# Patient Record
Sex: Female | Born: 1945
Health system: Southern US, Community
[De-identification: ages and names within clinical notes are randomized; demographics above are authoritative.]

## PROBLEM LIST (undated history)

## (undated) DIAGNOSIS — E663 Overweight: Secondary | ICD-10-CM

## (undated) DIAGNOSIS — K5730 Diverticulosis of large intestine without perforation or abscess without bleeding: Secondary | ICD-10-CM

## (undated) DIAGNOSIS — E7800 Pure hypercholesterolemia, unspecified: Secondary | ICD-10-CM

## (undated) DIAGNOSIS — I519 Heart disease, unspecified: Secondary | ICD-10-CM

## (undated) DIAGNOSIS — K219 Gastro-esophageal reflux disease without esophagitis: Secondary | ICD-10-CM

## (undated) DIAGNOSIS — G5602 Carpal tunnel syndrome, left upper limb: Secondary | ICD-10-CM

## (undated) DIAGNOSIS — I10 Essential (primary) hypertension: Secondary | ICD-10-CM

## (undated) DIAGNOSIS — J302 Other seasonal allergic rhinitis: Secondary | ICD-10-CM

## (undated) DIAGNOSIS — K642 Third degree hemorrhoids: Secondary | ICD-10-CM

## (undated) DIAGNOSIS — H409 Unspecified glaucoma: Secondary | ICD-10-CM

## (undated) DIAGNOSIS — J3089 Other allergic rhinitis: Secondary | ICD-10-CM

## (undated) DIAGNOSIS — Z8673 Personal history of transient ischemic attack (TIA), and cerebral infarction without residual deficits: Secondary | ICD-10-CM

## (undated) HISTORY — DX: Heart disease, unspecified: I51.9

## (undated) HISTORY — DX: Personal history of transient ischemic attack (TIA), and cerebral infarction without residual deficits: Z86.73

## (undated) HISTORY — DX: Other seasonal allergic rhinitis: J30.2

## (undated) HISTORY — DX: Pure hypercholesterolemia, unspecified: E78.00

## (undated) HISTORY — DX: Third degree hemorrhoids: K64.2

## (undated) HISTORY — DX: Gastro-esophageal reflux disease without esophagitis: K21.9

## (undated) HISTORY — DX: Overweight: E66.3

## (undated) HISTORY — DX: Essential (primary) hypertension: I10

## (undated) HISTORY — DX: Unspecified glaucoma: H40.9

## (undated) HISTORY — DX: Diverticulosis of large intestine without perforation or abscess without bleeding: K57.30

## (undated) HISTORY — DX: Other allergic rhinitis: J30.89

## (undated) HISTORY — DX: Carpal tunnel syndrome, left upper limb: G56.02

---

## 1978-03-30 HISTORY — PX: TUBAL LIGATION: SHX77

## 2002-07-12 ENCOUNTER — Ambulatory Visit (HOSPITAL_COMMUNITY): Admission: RE | Admit: 2002-07-12 | Discharge: 2002-07-12 | Payer: Self-pay | Admitting: *Deleted

## 2016-04-30 DIAGNOSIS — I10 Essential (primary) hypertension: Secondary | ICD-10-CM | POA: Diagnosis not present

## 2016-04-30 DIAGNOSIS — Z1231 Encounter for screening mammogram for malignant neoplasm of breast: Secondary | ICD-10-CM | POA: Diagnosis not present

## 2016-04-30 DIAGNOSIS — Z2821 Immunization not carried out because of patient refusal: Secondary | ICD-10-CM | POA: Diagnosis not present

## 2016-04-30 DIAGNOSIS — E785 Hyperlipidemia, unspecified: Secondary | ICD-10-CM | POA: Diagnosis not present

## 2016-05-25 DIAGNOSIS — Z1231 Encounter for screening mammogram for malignant neoplasm of breast: Secondary | ICD-10-CM | POA: Diagnosis not present

## 2016-06-26 DIAGNOSIS — H2513 Age-related nuclear cataract, bilateral: Secondary | ICD-10-CM | POA: Diagnosis not present

## 2016-06-26 DIAGNOSIS — H401131 Primary open-angle glaucoma, bilateral, mild stage: Secondary | ICD-10-CM | POA: Diagnosis not present

## 2016-08-11 DIAGNOSIS — D649 Anemia, unspecified: Secondary | ICD-10-CM | POA: Diagnosis not present

## 2016-08-11 DIAGNOSIS — L608 Other nail disorders: Secondary | ICD-10-CM | POA: Diagnosis not present

## 2016-09-24 DIAGNOSIS — H04123 Dry eye syndrome of bilateral lacrimal glands: Secondary | ICD-10-CM | POA: Diagnosis not present

## 2016-09-24 DIAGNOSIS — H1045 Other chronic allergic conjunctivitis: Secondary | ICD-10-CM | POA: Diagnosis not present

## 2016-10-07 DIAGNOSIS — H04123 Dry eye syndrome of bilateral lacrimal glands: Secondary | ICD-10-CM | POA: Diagnosis not present

## 2016-10-07 DIAGNOSIS — H401131 Primary open-angle glaucoma, bilateral, mild stage: Secondary | ICD-10-CM | POA: Diagnosis not present

## 2016-11-12 DIAGNOSIS — Z2821 Immunization not carried out because of patient refusal: Secondary | ICD-10-CM | POA: Diagnosis not present

## 2016-11-12 DIAGNOSIS — Z87891 Personal history of nicotine dependence: Secondary | ICD-10-CM | POA: Diagnosis not present

## 2016-11-12 DIAGNOSIS — I10 Essential (primary) hypertension: Secondary | ICD-10-CM | POA: Diagnosis not present

## 2016-11-12 DIAGNOSIS — Z1159 Encounter for screening for other viral diseases: Secondary | ICD-10-CM | POA: Diagnosis not present

## 2016-11-12 DIAGNOSIS — E785 Hyperlipidemia, unspecified: Secondary | ICD-10-CM | POA: Diagnosis not present

## 2016-12-11 ENCOUNTER — Emergency Department (HOSPITAL_COMMUNITY)
Admission: EM | Admit: 2016-12-11 | Discharge: 2016-12-11 | Disposition: A | Discharge status: Home / Self Care | Payer: Medicare Other | Attending: Emergency Medicine | Admitting: Emergency Medicine

## 2016-12-11 ENCOUNTER — Emergency Department (HOSPITAL_COMMUNITY): Payer: Medicare Other

## 2016-12-11 ENCOUNTER — Encounter (HOSPITAL_COMMUNITY): Payer: Self-pay | Admitting: Emergency Medicine

## 2016-12-11 DIAGNOSIS — R55 Syncope and collapse: Secondary | ICD-10-CM | POA: Diagnosis not present

## 2016-12-11 DIAGNOSIS — R531 Weakness: Secondary | ICD-10-CM | POA: Diagnosis not present

## 2016-12-11 DIAGNOSIS — I10 Essential (primary) hypertension: Secondary | ICD-10-CM | POA: Diagnosis not present

## 2016-12-11 DIAGNOSIS — R42 Dizziness and giddiness: Secondary | ICD-10-CM | POA: Insufficient documentation

## 2016-12-11 DIAGNOSIS — Z79899 Other long term (current) drug therapy: Secondary | ICD-10-CM | POA: Diagnosis not present

## 2016-12-11 DIAGNOSIS — R404 Transient alteration of awareness: Secondary | ICD-10-CM | POA: Diagnosis not present

## 2016-12-11 LAB — TROPONIN I

## 2016-12-11 LAB — BASIC METABOLIC PANEL
Anion gap: 11 (ref 5–15)
BUN: 8 mg/dL (ref 6–20)
CALCIUM: 10.2 mg/dL (ref 8.9–10.3)
CO2: 26 mmol/L (ref 22–32)
CREATININE: 0.8 mg/dL (ref 0.44–1.00)
Chloride: 97 mmol/L — ABNORMAL LOW (ref 101–111)
GFR calc Af Amer: >60 mL/min (ref >60)
Glucose, Bld: 95 mg/dL (ref 65–99)
Potassium: 3.6 mmol/L (ref 3.5–5.1)
SODIUM: 134 mmol/L — AB (ref 135–145)

## 2016-12-11 LAB — CBC WITH DIFFERENTIAL/PLATELET
BASOS ABS: 0 10*3/uL (ref 0.0–0.1)
Basophils Relative: 0 %
EOS PCT: 1 %
Eosinophils Absolute: 0.1 10*3/uL (ref 0.0–0.7)
HCT: 41.1 % (ref 36.0–46.0)
Hemoglobin: 13.9 g/dL (ref 12.0–15.0)
Lymphocytes Relative: 13 %
Lymphs Abs: 1.1 10*3/uL (ref 0.7–4.0)
MCH: 26.2 pg (ref 26.0–34.0)
MCHC: 33.8 g/dL (ref 30.0–36.0)
MCV: 77.5 fL — ABNORMAL LOW (ref 78.0–100.0)
Monocytes Absolute: 0.5 10*3/uL (ref 0.1–1.0)
Monocytes Relative: 5 %
Neutro Abs: 6.8 10*3/uL (ref 1.7–7.7)
Neutrophils Relative %: 81 %
PLATELETS: 246 10*3/uL (ref 150–400)
RBC: 5.3 MIL/uL — AB (ref 3.87–5.11)
RDW: 13.1 % (ref 11.5–15.5)
WBC: 8.5 10*3/uL (ref 4.0–10.5)

## 2016-12-11 LAB — CBG MONITORING, ED: Glucose-Capillary: 103 mg/dL — ABNORMAL HIGH (ref 65–99)

## 2016-12-11 LAB — URINALYSIS, ROUTINE W REFLEX MICROSCOPIC
BACTERIA UA: NONE SEEN
BILIRUBIN URINE: NEGATIVE
Glucose, UA: NEGATIVE mg/dL
HGB URINE DIPSTICK: NEGATIVE
Ketones, ur: NEGATIVE mg/dL
NITRITE: NEGATIVE
PROTEIN: NEGATIVE mg/dL
Specific Gravity, Urine: 1.011 (ref 1.005–1.030)
pH: 7 (ref 5.0–8.0)

## 2016-12-11 NOTE — ED Notes (Signed)
Patient denies having anything to eat today. Patient has drank prune juice, tea, coffee and on 2nd bottle of water when felt like going to pass out.

## 2016-12-11 NOTE — Discharge Instructions (Signed)
Get plenty of rest, and drink a lot of fluids.  See your doctor for a check up in 1-2 weeks. Return here if needed.

## 2016-12-11 NOTE — Care Management Note (Signed)
Case Management Note  CM consulted for no pcp.  Spoke with pt who states she has Medicare and Medicaid, recently moving here from the [place] part of the state.  She states there are so many doctors she doesn't know how to choose one.   Advised pt that at time of D/C there would be a thick black band printed a few pages back on her D/C paperwork for assistance with finding a provider.  She stated she will call them for help.  No further CM needs noted at this time.

## 2016-12-11 NOTE — ED Provider Notes (Signed)
[place] DEPT Provider Note   CSN: [id number] Arrival date & time: 12/11/16  1507     History   Chief Complaint Chief Complaint  Patient presents with  . Near Syncope    HPI Barbara Holt is a 71 y.o. female.  She presents for evaluation of near syncope, which occurred suddenly while she was sitting and writing.  She felt her head jerked forward, then felt woozy for about 15 minutes.  She presents by EMS and was noted to be unsteady with walking.  She denies recent fever, chills, nausea, vomiting, dysuria, constipation, diarrhea, back pain or abdominal pain.  No prior similar problems.  There are no other known modifying factors.  HPI  Past Medical History:  Diagnosis Date  . High cholesterol   . Hypertension     There are no active problems to display for this patient.   History reviewed. No pertinent surgical history.  OB History    No data available       Home Medications    Prior to Admission medications   Medication Sig Start Date End Date Taking? Authorizing Provider  potassium chloride SA (K-DUR,KLOR-CON) 20 MEQ tablet Take 1 tablet by mouth daily. 10/29/16 10/29/17 Yes [provider]  pravastatin (PRAVACHOL) 20 MG tablet Take 20 mg by mouth daily. 10/29/16 10/29/17 Yes [provider]  atenolol-chlorthalidone (TENORETIC) 100-25 MG tablet Take 1 tablet by mouth every morning. 10/30/16   [provider]  latanoprost (XALATAN) 0.005 % ophthalmic solution Place 1 drop into both eyes daily.    [provider]  timolol (BETIMOL) 0.5 % ophthalmic solution Place 1 drop into both eyes daily.    [provider]    Family History No family history on file.  Social History Social History  Substance Use Topics  . Smoking status: Never Smoker  . Smokeless tobacco: Never Used  . Alcohol use No     Allergies   Patient has no known allergies.   Review of Systems Review of Systems  All other systems reviewed and are  negative.    Physical Exam Updated Vital Signs BP 137/86   Pulse 69   Temp 97.6 F (36.4 C) (Oral)   Resp 15   SpO2 100%   Physical Exam  Constitutional: She is oriented to person, place, and time. She appears well-developed and well-nourished. No distress.  HENT:  Head: Normocephalic and atraumatic.  Eyes: Pupils are equal, round, and reactive to light. Conjunctivae and EOM are normal.  Neck: Normal range of motion and phonation normal. Neck supple.  Cardiovascular: Normal rate and regular rhythm.   Pulmonary/Chest: Effort normal and breath sounds normal. She exhibits no tenderness.  Abdominal: Soft. She exhibits no distension. There is no tenderness. There is no guarding.  Musculoskeletal: Normal range of motion.  Normal strength arms and legs bilaterally.  Normal gait.  Neurological: She is alert and oriented to person, place, and time. She exhibits normal muscle tone.  No dysarthria, aphasia or nystagmus.  No pronator drift.  No ataxia.  Skin: Skin is warm and dry.  Psychiatric: She has a normal mood and affect. Her behavior is normal. Judgment and thought content normal.  Nursing note and vitals reviewed.    ED Treatments / Results  Labs (all labs ordered are listed, but only abnormal results are displayed) Labs Reviewed  URINALYSIS, ROUTINE W REFLEX MICROSCOPIC - Abnormal; Notable for the following:       Result Value   Leukocytes, UA LARGE (*)  Squamous Epithelial / LPF 0-5 (*)    All other components within normal limits  BASIC METABOLIC PANEL - Abnormal; Notable for the following:    Sodium 134 (*)    Chloride 97 (*)    All other components within normal limits  CBC WITH DIFFERENTIAL/PLATELET - Abnormal; Notable for the following:    RBC 5.30 (*)    MCV 77.5 (*)    All other components within normal limits  CBG MONITORING, ED - Abnormal; Notable for the following:    Glucose-Capillary 103 (*)    All other components within normal limits  TROPONIN I     EKG  EKG Interpretation  Date/Time:  Friday December 11 2016 15:25:35 EDT Ventricular Rate:  66 PR Interval:    QRS Duration: 85 QT Interval:  405 QTC Calculation: 425 R Axis:   64 Text Interpretation:  Sinus rhythm Low voltage, precordial leads No old tracing to compare Confirmed by Daleen Bo [phone]) on 12/11/2016 7:01:33 PM       Radiology Dg Chest 2 View  Result Date: 12/11/2016 CLINICAL DATA:  71 year old female with syncope. EXAM: CHEST  2 VIEW COMPARISON:  None. FINDINGS: The heart size and mediastinal contours are within normal limits. Both lungs are clear. The visualized skeletal structures are unremarkable. IMPRESSION: No active cardiopulmonary disease. Electronically Signed   By: Kristopher Oppenheim M.D.   On: 12/11/2016 17:45   Ct Head Wo Contrast  Result Date: 12/11/2016 CLINICAL DATA:  71 year old female with syncopal episode. EXAM: CT HEAD WITHOUT CONTRAST TECHNIQUE: Contiguous axial images were obtained from the base of the skull through the vertex without intravenous contrast. COMPARISON:  None. FINDINGS: Brain: No evidence of acute infarction, hemorrhage, hydrocephalus, extra-axial collection or mass lesion/mass effect. Vascular: No hyperdense vessel or unexpected calcification. Skull: Normal. Negative for fracture or focal lesion. Sinuses/Orbits: The visualized sinuses and mastoid air cells are clear. The orbits are symmetric and intact. IMPRESSION: Unremarkable CT evaluation of the brain. Electronically Signed   By: Kristopher Oppenheim M.D.   On: 12/11/2016 17:48    Procedures Procedures (including critical care time)  Medications Ordered in ED Medications - No data to display   Initial Impression / Assessment and Plan / ED Course  I have reviewed the triage vital signs and the nursing notes.  Pertinent labs & imaging results that were available during my care of the patient were reviewed by me and considered in my medical decision making (see chart for  details).      Patient Vitals for the past 24 hrs:  BP Temp Temp src Pulse Resp SpO2  12/11/16 1838 137/86 - - 69 15 100 %  12/11/16 1525 110/74 97.6 F (36.4 C) Oral 66 19 100 %    7:04 PM Reevaluation with update and discussion. After initial assessment and treatment, an updated evaluation reveals no change in clinical status.  The patient remains comfortable.  Findings discussed with patient and daughter, all questions were answered. Syra Sirmons L     Final Clinical Impressions(s) / ED Diagnoses   Final diagnoses:  Near syncope    Transient near syncope, without residual symptoms.  Etiology is unclear.  Doubt CVA, TIA, metabolic instability serious bacterial infection or impending vascular collapse  Nursing Notes Reviewed/ Care Coordinated Applicable Imaging Reviewed Interpretation of Laboratory Data incorporated into ED treatment  The patient appears reasonably screened and/or stabilized for discharge and I doubt any other medical condition or other [facility] requiring further screening, evaluation, or treatment in the ED at this  time prior to discharge.  Plan: Home Medications-continue usual medications; Home Treatments-rest, fluids; return here if the recommended treatment, does not improve the symptoms; Recommended follow up-PCP, as needed    New Prescriptions New Prescriptions   No medications on file     Daleen Bo, MD 12/11/16 [phone]

## 2016-12-11 NOTE — ED Triage Notes (Signed)
Per [facility] patient was from home for near syncope episode while sitting at table writing something down.  Patient denies LOC but just had weakness. Was unsteady per EMS when took few steps to stretcher. Did have occasional PVC on 12 lead per EMS.

## 2016-12-11 NOTE — ED Notes (Signed)
Bed: WA09 Expected date:  Expected time:  Means of arrival:  Comments: 71 yo dizziness

## 2016-12-22 DIAGNOSIS — H04123 Dry eye syndrome of bilateral lacrimal glands: Secondary | ICD-10-CM | POA: Diagnosis not present

## 2016-12-22 DIAGNOSIS — H401131 Primary open-angle glaucoma, bilateral, mild stage: Secondary | ICD-10-CM | POA: Diagnosis not present

## 2016-12-22 DIAGNOSIS — H2513 Age-related nuclear cataract, bilateral: Secondary | ICD-10-CM | POA: Diagnosis not present

## 2016-12-31 DIAGNOSIS — L239 Allergic contact dermatitis, unspecified cause: Secondary | ICD-10-CM | POA: Diagnosis not present

## 2017-01-03 DIAGNOSIS — I10 Essential (primary) hypertension: Secondary | ICD-10-CM | POA: Diagnosis not present

## 2017-01-03 DIAGNOSIS — T7840XD Allergy, unspecified, subsequent encounter: Secondary | ICD-10-CM | POA: Diagnosis not present

## 2017-01-03 DIAGNOSIS — Z87891 Personal history of nicotine dependence: Secondary | ICD-10-CM | POA: Diagnosis not present

## 2017-01-03 DIAGNOSIS — Z79899 Other long term (current) drug therapy: Secondary | ICD-10-CM | POA: Diagnosis not present

## 2017-01-06 DIAGNOSIS — L240 Irritant contact dermatitis due to detergents: Secondary | ICD-10-CM | POA: Diagnosis not present

## 2017-01-20 DIAGNOSIS — R21 Rash and other nonspecific skin eruption: Secondary | ICD-10-CM | POA: Diagnosis not present

## 2017-01-30 DIAGNOSIS — M7989 Other specified soft tissue disorders: Secondary | ICD-10-CM | POA: Diagnosis not present

## 2017-01-30 DIAGNOSIS — I10 Essential (primary) hypertension: Secondary | ICD-10-CM | POA: Diagnosis not present

## 2017-01-30 DIAGNOSIS — S46211A Strain of muscle, fascia and tendon of other parts of biceps, right arm, initial encounter: Secondary | ICD-10-CM | POA: Diagnosis not present

## 2017-01-30 DIAGNOSIS — Z87891 Personal history of nicotine dependence: Secondary | ICD-10-CM | POA: Diagnosis not present

## 2017-02-17 DIAGNOSIS — L309 Dermatitis, unspecified: Secondary | ICD-10-CM | POA: Diagnosis not present

## 2017-04-28 DIAGNOSIS — H401131 Primary open-angle glaucoma, bilateral, mild stage: Secondary | ICD-10-CM | POA: Diagnosis not present

## 2017-04-28 DIAGNOSIS — H2513 Age-related nuclear cataract, bilateral: Secondary | ICD-10-CM | POA: Diagnosis not present

## 2017-05-19 DIAGNOSIS — Z0000 Encounter for general adult medical examination without abnormal findings: Secondary | ICD-10-CM | POA: Diagnosis not present

## 2017-05-28 DIAGNOSIS — I10 Essential (primary) hypertension: Secondary | ICD-10-CM | POA: Diagnosis not present

## 2017-05-28 DIAGNOSIS — E785 Hyperlipidemia, unspecified: Secondary | ICD-10-CM | POA: Diagnosis not present

## 2017-07-31 ENCOUNTER — Observation Stay (HOSPITAL_COMMUNITY): Payer: Medicare Other

## 2017-07-31 ENCOUNTER — Encounter (HOSPITAL_COMMUNITY): Payer: Self-pay

## 2017-07-31 ENCOUNTER — Other Ambulatory Visit: Payer: Self-pay

## 2017-07-31 ENCOUNTER — Other Ambulatory Visit (HOSPITAL_COMMUNITY): Payer: Medicare Other

## 2017-07-31 ENCOUNTER — Observation Stay (HOSPITAL_COMMUNITY)
Admission: EM | Admit: 2017-07-31 | Discharge: 2017-08-01 | Disposition: A | Discharge status: Home / Self Care | Payer: Medicare Other | Attending: Internal Medicine | Admitting: Internal Medicine

## 2017-07-31 ENCOUNTER — Emergency Department (HOSPITAL_COMMUNITY): Payer: Medicare Other

## 2017-07-31 DIAGNOSIS — E7800 Pure hypercholesterolemia, unspecified: Secondary | ICD-10-CM | POA: Diagnosis not present

## 2017-07-31 DIAGNOSIS — Z79899 Other long term (current) drug therapy: Secondary | ICD-10-CM | POA: Diagnosis not present

## 2017-07-31 DIAGNOSIS — G459 Transient cerebral ischemic attack, unspecified: Principal | ICD-10-CM | POA: Insufficient documentation

## 2017-07-31 DIAGNOSIS — R200 Anesthesia of skin: Secondary | ICD-10-CM | POA: Diagnosis not present

## 2017-07-31 DIAGNOSIS — R29818 Other symptoms and signs involving the nervous system: Secondary | ICD-10-CM | POA: Diagnosis not present

## 2017-07-31 DIAGNOSIS — R531 Weakness: Secondary | ICD-10-CM | POA: Diagnosis not present

## 2017-07-31 DIAGNOSIS — I10 Essential (primary) hypertension: Secondary | ICD-10-CM

## 2017-07-31 DIAGNOSIS — Z8673 Personal history of transient ischemic attack (TIA), and cerebral infarction without residual deficits: Secondary | ICD-10-CM

## 2017-07-31 DIAGNOSIS — R51 Headache: Secondary | ICD-10-CM | POA: Diagnosis not present

## 2017-07-31 HISTORY — DX: Personal history of transient ischemic attack (TIA), and cerebral infarction without residual deficits: Z86.73

## 2017-07-31 LAB — DIFFERENTIAL
Basophils Absolute: 0 10*3/uL (ref 0.0–0.1)
Basophils Relative: 0 %
EOS PCT: 4 %
Eosinophils Absolute: 0.3 10*3/uL (ref 0.0–0.7)
LYMPHS PCT: 34 %
Lymphs Abs: 2.5 10*3/uL (ref 0.7–4.0)
MONO ABS: 0.5 10*3/uL (ref 0.1–1.0)
MONOS PCT: 7 %
NEUTROS ABS: 4.2 10*3/uL (ref 1.7–7.7)
Neutrophils Relative %: 55 %

## 2017-07-31 LAB — CBC
HEMATOCRIT: 39.1 % (ref 36.0–46.0)
HEMATOCRIT: 38.6 % (ref 36.0–46.0)
HEMOGLOBIN: 13 g/dL (ref 12.0–15.0)
Hemoglobin: 13 g/dL (ref 12.0–15.0)
MCH: 25.9 pg — AB (ref 26.0–34.0)
MCH: 25.9 pg — AB (ref 26.0–34.0)
MCHC: 33.2 g/dL (ref 30.0–36.0)
MCHC: 33.7 g/dL (ref 30.0–36.0)
MCV: 77.9 fL — AB (ref 78.0–100.0)
MCV: 77 fL — AB (ref 78.0–100.0)
PLATELETS: 295 10*3/uL (ref 150–400)
Platelets: 316 10*3/uL (ref 150–400)
RBC: 5.02 MIL/uL (ref 3.87–5.11)
RBC: 5.01 MIL/uL (ref 3.87–5.11)
RDW: 13.2 % (ref 11.5–15.5)
RDW: 13.1 % (ref 11.5–15.5)
WBC: 7.4 10*3/uL (ref 4.0–10.5)
WBC: 8.2 10*3/uL (ref 4.0–10.5)

## 2017-07-31 LAB — I-STAT CHEM 8, ED
BUN: 13 mg/dL (ref 6–20)
CALCIUM ION: 1.18 mmol/L (ref 1.15–1.40)
CHLORIDE: 93 mmol/L — AB (ref 101–111)
Creatinine, Ser: 0.8 mg/dL (ref 0.44–1.00)
GLUCOSE: 99 mg/dL (ref 65–99)
HCT: 40 % (ref 36.0–46.0)
HEMOGLOBIN: 13.6 g/dL (ref 12.0–15.0)
Potassium: 3.5 mmol/L (ref 3.5–5.1)
Sodium: 134 mmol/L — ABNORMAL LOW (ref 135–145)
TCO2: 27 mmol/L (ref 22–32)

## 2017-07-31 LAB — I-STAT TROPONIN, ED: TROPONIN I, POC: 0 ng/mL (ref 0.00–0.08)

## 2017-07-31 LAB — COMPREHENSIVE METABOLIC PANEL
ALK PHOS: 67 U/L (ref 38–126)
ALT: 23 U/L (ref 14–54)
ANION GAP: 9 (ref 5–15)
AST: 24 U/L (ref 15–41)
Albumin: 3.7 g/dL (ref 3.5–5.0)
BILIRUBIN TOTAL: 0.4 mg/dL (ref 0.3–1.2)
BUN: 11 mg/dL (ref 6–20)
CALCIUM: 9.4 mg/dL (ref 8.9–10.3)
CO2: 29 mmol/L (ref 22–32)
Chloride: 95 mmol/L — ABNORMAL LOW (ref 101–111)
Creatinine, Ser: 0.89 mg/dL (ref 0.44–1.00)
Glucose, Bld: 105 mg/dL — ABNORMAL HIGH (ref 65–99)
Potassium: 3.5 mmol/L (ref 3.5–5.1)
Sodium: 133 mmol/L — ABNORMAL LOW (ref 135–145)
TOTAL PROTEIN: 7 g/dL (ref 6.5–8.1)

## 2017-07-31 LAB — PROTIME-INR
INR: 1.04
PROTHROMBIN TIME: 13.5 s (ref 11.4–15.2)

## 2017-07-31 LAB — APTT: aPTT: 33 seconds (ref 24–36)

## 2017-07-31 LAB — CREATININE, SERUM
Creatinine, Ser: 0.78 mg/dL (ref 0.44–1.00)
GFR calc non Af Amer: >60 mL/min (ref >60)

## 2017-07-31 MED ORDER — PRAVASTATIN SODIUM 40 MG PO TABS: 20.0000 mg | ORAL_TABLET | Freq: Every day | ORAL | Status: DC

## 2017-07-31 MED ORDER — LEVOBUNOLOL HCL 0.5 % OP SOLN
1.0000 [drp] | Freq: Every morning | OPHTHALMIC | Status: DC
  Administered 2017-08-01: 1 [drp] via OPHTHALMIC
  Filled 2017-07-31: qty 5

## 2017-07-31 MED ORDER — ACETAMINOPHEN 650 MG RE SUPP: 650.0000 mg | Freq: Four times a day (QID) | RECTAL | Status: DC | PRN

## 2017-07-31 MED ORDER — ASPIRIN EC 81 MG PO TBEC
81.0000 mg | DELAYED_RELEASE_TABLET | Freq: Every day | ORAL | Status: DC
  Filled 2017-07-31: qty 1

## 2017-07-31 MED ORDER — STROKE: EARLY STAGES OF RECOVERY BOOK
Freq: Once | Status: DC
  Filled 2017-07-31: qty 1

## 2017-07-31 MED ORDER — ACETAMINOPHEN 325 MG PO TABS: 650.0000 mg | ORAL_TABLET | Freq: Four times a day (QID) | ORAL | Status: DC | PRN

## 2017-07-31 MED ORDER — ENOXAPARIN SODIUM 40 MG/0.4ML ~~LOC~~ SOLN: 40.0000 mg | SUBCUTANEOUS | Status: DC

## 2017-07-31 MED ORDER — LATANOPROST 0.005 % OP SOLN
1.0000 [drp] | Freq: Every day | OPHTHALMIC | Status: DC
  Filled 2017-07-31: qty 2.5

## 2017-07-31 MED ORDER — IOPAMIDOL (ISOVUE-370) INJECTION 76%
INTRAVENOUS | Status: AC
  Filled 2017-07-31: qty 50

## 2017-07-31 MED ORDER — ATORVASTATIN CALCIUM 40 MG PO TABS: 40.0000 mg | ORAL_TABLET | Freq: Every day | ORAL | Status: DC

## 2017-07-31 MED ORDER — POLYETHYLENE GLYCOL 3350 17 G PO PACK: 17.0000 g | PACK | Freq: Every day | ORAL | Status: DC | PRN

## 2017-07-31 NOTE — ED Provider Notes (Signed)
[facility] EMERGENCY DEPARTMENT Provider Note   CSN: [id number] Arrival date & time: 07/31/17  1529     History   Chief Complaint Chief Complaint  Patient presents with  . Code Stroke    LNW 1330    HPI Adelina Collard is a 72 y.o. female.  HPI   72 year old female with a history of hypertension hyperlipidemia presents with concern for acute onset of headache and left-sided numbness and weakness.  Patient reports that when she woke up this morning, she is in normal state of health, however about 130, she developed 2 episodes of brief stabbing headache to the left side, and also developed left-sided weakness and numbness of the arm.  Reports she was able to use her arm, but it was weaker than it typically is, and weaker than the other side.  Denies any facial droop, trouble walking, trouble talking, change in vision or other concerns.  Denies chest pain, shortness of breath, nausea or vomiting.  Past Medical History:  Diagnosis Date  . High cholesterol   . Hypertension     Patient Active Problem List   Diagnosis Date Noted  . TIA (transient ischemic attack) 07/31/2017    History reviewed. No pertinent surgical history.   OB History   None      Home Medications    Prior to Admission medications   Medication Sig Start Date End Date Taking? Authorizing Provider  aspirin EC 81 MG tablet Take 81 mg by mouth daily.   Yes [provider]  atenolol-chlorthalidone (TENORETIC) 100-25 MG tablet Take 1 tablet by mouth every morning. 10/30/16  Yes [provider]  latanoprost (XALATAN) 0.005 % ophthalmic solution Place 1 drop into both eyes daily.   Yes [provider]  levobunolol (BETAGAN) 0.5 % ophthalmic solution Place 1 drop into both eyes every morning. 07/22/17  Yes [provider]  potassium chloride SA (K-DUR,KLOR-CON) 20 MEQ tablet Take 1 tablet by mouth daily. 10/29/16 10/29/17 Yes [provider]  pravastatin  (PRAVACHOL) 20 MG tablet Take 20 mg by mouth daily. 10/29/16 10/29/17 Yes [provider]    Family History History reviewed. No pertinent family history.  Social History Social History   Tobacco Use  . Smoking status: Never Smoker  . Smokeless tobacco: Never Used  Substance Use Topics  . Alcohol use: No  . Drug use: Not on file     Allergies   Patient has no known allergies.   Review of Systems Review of Systems  Constitutional: Negative for fever.  HENT: Negative for sore throat.   Eyes: Negative for visual disturbance.  Respiratory: Negative for cough and shortness of breath.   Cardiovascular: Negative for chest pain.  Gastrointestinal: Negative for abdominal pain.  Genitourinary: Negative for difficulty urinating.  Musculoskeletal: Negative for back pain and neck pain.  Skin: Negative for rash.  Neurological: Positive for weakness, numbness and headaches. Negative for dizziness, syncope, facial asymmetry and speech difficulty.     Physical Exam Updated Vital Signs BP (!) 129/93 (BP Location: Right Arm)   Pulse 66   Temp 98.4 F (36.9 C) (Oral)   Resp 16   Ht 5\' 4"  (1.626 m)   Wt 68.7 kg (151 lb 7.3 oz)   SpO2 98%   BMI 26.00 kg/m   Physical Exam  Constitutional: She is oriented to person, place, and time. She appears well-developed and well-nourished. No distress.  HENT:  Head: Normocephalic and atraumatic.  Eyes: Conjunctivae and EOM are normal.  Neck: Normal range of motion.  Cardiovascular: Normal rate, regular rhythm, normal heart sounds and intact distal pulses. Exam reveals no gallop and no friction rub.  No murmur heard. Pulmonary/Chest: Effort normal and breath sounds normal. No respiratory distress. She has no wheezes. She has no rales.  Abdominal: Soft. She exhibits no distension. There is no tenderness. There is no guarding.  Musculoskeletal: She exhibits no edema or tenderness.  Neurological: She is alert and oriented to person, place,  and time. She has normal strength. A sensory deficit (altered left arm) is present. No cranial nerve deficit. GCS eye subscore is 4. GCS verbal subscore is 5. GCS motor subscore is 6.  Skin: Skin is warm and dry. No rash noted. She is not diaphoretic. No erythema.  Nursing note and vitals reviewed.    ED Treatments / Results  Labs (all labs ordered are listed, but only abnormal results are displayed) Labs Reviewed  CBC - Abnormal; Notable for the following components:      Result Value   MCV 77.9 (*)    MCH 25.9 (*)    All other components within normal limits  COMPREHENSIVE METABOLIC PANEL - Abnormal; Notable for the following components:   Sodium 133 (*)    Chloride 95 (*)    Glucose, Bld 105 (*)    All other components within normal limits  CBC - Abnormal; Notable for the following components:   MCV 77.0 (*)    MCH 25.9 (*)    All other components within normal limits  I-STAT CHEM 8, ED - Abnormal; Notable for the following components:   Sodium 134 (*)    Chloride 93 (*)    All other components within normal limits  PROTIME-INR  APTT  DIFFERENTIAL  CREATININE, SERUM  HEMOGLOBIN A1C  LIPID PANEL  I-STAT TROPONIN, ED  CBG MONITORING, ED    EKG EKG Interpretation  Date/Time:  Saturday Jul 31 2017 15:56:59 EDT Ventricular Rate:  90 PR Interval:    QRS Duration: 84 QT Interval:  367 QTC Calculation: 449 R Axis:   76 Text Interpretation:  Sinus rhythm Ventricular premature complex Borderline prolonged PR interval Anteroseptal infarct, age indeterminate No previous ECGs available Confirmed by Gareth Morgan [phone]) on 07/31/2017 4:20:55 PM   Radiology Mr Jodene Nam Head Wo Contrast  Result Date: 07/31/2017 CLINICAL DATA:  Initial evaluation for acute left arm weakness, numbness, now improved. EXAM: MRI HEAD WITHOUT CONTRAST MRA HEAD WITHOUT CONTRAST TECHNIQUE: Multiplanar, multiecho pulse sequences of the brain and surrounding structures were obtained without intravenous  contrast. Angiographic images of the head were obtained using MRA technique without contrast. COMPARISON:  Prior CT from earlier the same day. FINDINGS: MRI HEAD FINDINGS Brain: Cerebral volume within normal limits for age. Patchy T2/FLAIR hyperintensity within the periventricular deep white matter both cerebral hemispheres most consistent with chronic small vessel ischemic change, mild to moderate nature. Chronic microvascular changes present within the pons as well. No abnormal foci of restricted diffusion to suggest acute or subacute ischemia. Gray-white matter differentiation maintained. No areas of remote cortical infarction. No evidence for acute or chronic intracranial hemorrhage. No mass lesion, midline shift or mass effect. No hydrocephalus. No extra-axial fluid collection. Major dural sinuses grossly patent. Pituitary gland suprasellar region normal. Midline structures intact and normal. Vascular: Major intracranial vascular flow voids are well maintained. Skull and upper cervical spine: Craniocervical junction normal. Upper cervical spine within normal limits. Bone marrow signal intensity normal. No scalp soft tissue abnormality. Sinuses/Orbits: Globes and orbital soft tissues  within normal limits. Mild scattered mucosal thickening within the left ethmoidal air cells. Small left maxillary sinus retention cyst. No mastoid effusion. Inner ear structures normal. Other: None. MRA HEAD FINDINGS ANTERIOR CIRCULATION: Distal cervical segments of the internal carotid arteries are widely patent with antegrade flow. Petrous, cavernous, and supraclinoid segments of the internal carotid arteries are widely patent without stenosis. Origin of the ophthalmic arteries patent bilaterally. ICA termini widely patent. A1 segments, anterior communicating artery common anterior cerebral arteries widely patent without stenosis. M1 segments widely patent without stenosis. Normal MCA bifurcations. Distal MCA branches well  perfused and symmetric. Mild distal small vessel atheromatous irregularity. POSTERIOR CIRCULATION: Vertebral arteries widely patent to the vertebrobasilar junction. Right vertebral artery slightly dominant. Posterior inferior cerebral arteries patent bilaterally. Basilar artery widely patent to its distal aspect. Superior cerebellar and posterior cerebral arteries widely patent bilaterally. No aneurysm. IMPRESSION: MRI HEAD IMPRESSION 1. No acute intracranial abnormality identified. 2. Mild to moderate chronic small vessel ischemic disease. Otherwise unremarkable brain MRI. MRA HEAD IMPRESSION 1. Mild distal small vessel atheromatous irregularity. 2. Otherwise normal intracranial MRA. No large vessel occlusion. No proximal high-grade or correctable stenosis identified. Electronically Signed   By: Jeannine Boga M.D.   On: 07/31/2017 21:45   Mr Brain Wo Contrast  Result Date: 07/31/2017 CLINICAL DATA:  Initial evaluation for acute left arm weakness, numbness, now improved. EXAM: MRI HEAD WITHOUT CONTRAST MRA HEAD WITHOUT CONTRAST TECHNIQUE: Multiplanar, multiecho pulse sequences of the brain and surrounding structures were obtained without intravenous contrast. Angiographic images of the head were obtained using MRA technique without contrast. COMPARISON:  Prior CT from earlier the same day. FINDINGS: MRI HEAD FINDINGS Brain: Cerebral volume within normal limits for age. Patchy T2/FLAIR hyperintensity within the periventricular deep white matter both cerebral hemispheres most consistent with chronic small vessel ischemic change, mild to moderate nature. Chronic microvascular changes present within the pons as well. No abnormal foci of restricted diffusion to suggest acute or subacute ischemia. Gray-white matter differentiation maintained. No areas of remote cortical infarction. No evidence for acute or chronic intracranial hemorrhage. No mass lesion, midline shift or mass effect. No hydrocephalus. No  extra-axial fluid collection. Major dural sinuses grossly patent. Pituitary gland suprasellar region normal. Midline structures intact and normal. Vascular: Major intracranial vascular flow voids are well maintained. Skull and upper cervical spine: Craniocervical junction normal. Upper cervical spine within normal limits. Bone marrow signal intensity normal. No scalp soft tissue abnormality. Sinuses/Orbits: Globes and orbital soft tissues within normal limits. Mild scattered mucosal thickening within the left ethmoidal air cells. Small left maxillary sinus retention cyst. No mastoid effusion. Inner ear structures normal. Other: None. MRA HEAD FINDINGS ANTERIOR CIRCULATION: Distal cervical segments of the internal carotid arteries are widely patent with antegrade flow. Petrous, cavernous, and supraclinoid segments of the internal carotid arteries are widely patent without stenosis. Origin of the ophthalmic arteries patent bilaterally. ICA termini widely patent. A1 segments, anterior communicating artery common anterior cerebral arteries widely patent without stenosis. M1 segments widely patent without stenosis. Normal MCA bifurcations. Distal MCA branches well perfused and symmetric. Mild distal small vessel atheromatous irregularity. POSTERIOR CIRCULATION: Vertebral arteries widely patent to the vertebrobasilar junction. Right vertebral artery slightly dominant. Posterior inferior cerebral arteries patent bilaterally. Basilar artery widely patent to its distal aspect. Superior cerebellar and posterior cerebral arteries widely patent bilaterally. No aneurysm. IMPRESSION: MRI HEAD IMPRESSION 1. No acute intracranial abnormality identified. 2. Mild to moderate chronic small vessel ischemic disease. Otherwise unremarkable brain MRI. MRA HEAD IMPRESSION 1.  Mild distal small vessel atheromatous irregularity. 2. Otherwise normal intracranial MRA. No large vessel occlusion. No proximal high-grade or correctable stenosis  identified. Electronically Signed   By: Jeannine Boga M.D.   On: 07/31/2017 21:45   Ct Head Code Stroke Wo Contrast  Result Date: 07/31/2017 CLINICAL DATA:  Code stroke. Acute onset of left arm weakness and numbness. Left facial numbness. Symptoms began 2 hours ago. EXAM: CT HEAD WITHOUT CONTRAST TECHNIQUE: Contiguous axial images were obtained from the base of the skull through the vertex without intravenous contrast. COMPARISON:  None. FINDINGS: Brain: Basal ganglia are intact bilaterally. Insular ribbon is normal. No acute or focal cortical abnormalities are present. Subcortical white matter changes are present bilaterally. Brainstem and cerebellum are normal. Vascular: No acute infarct, hemorrhage, or mass lesion is present. Skull: Calvarium is intact. No focal lytic or blastic lesions are present. No significant extracranial soft tissue injuries are present. Sinuses/Orbits: A polyp or is noted posteriorly within mucous retention cyst the left maxillary sinus. The remaining paranasal sinuses and mastoid air cells are clear. Globes and orbits are within normal limits. ASPECTS [facility] Stroke Program Early CT Score) - Ganglionic level infarction (caudate, lentiform nuclei, internal capsule, insula, M1-M3 cortex): 7/7 - Supraganglionic infarction (M4-M6 cortex): 3/3 Total score (0-10 with 10 being normal): 10/10 IMPRESSION: 1. No acute intracranial abnormality. 2. Diffuse subcortical white matter changes are mildly advanced for age. These likely reflect the sequela of chronic microvascular ischemia 3. ASPECTS is 10/10 The above was relayed via text pager to Dr. Leonel Ramsay on 07/31/2017 at 15:55 . Electronically Signed   By: San Morelle M.D.   On: 07/31/2017 15:56    Procedures Procedures (including critical care time)  Medications Ordered in ED Medications  iopamidol (ISOVUE-370) 76 % injection (has no administration in time range)   stroke: mapping our early stages of recovery book (has  no administration in time range)  enoxaparin (LOVENOX) injection 40 mg (40 mg Subcutaneous Not Given 07/31/17 2250)  acetaminophen (TYLENOL) tablet 650 mg (has no administration in time range)    Or  acetaminophen (TYLENOL) suppository 650 mg (has no administration in time range)  polyethylene glycol (MIRALAX / GLYCOLAX) packet 17 g (has no administration in time range)  aspirin EC tablet 81 mg (has no administration in time range)  latanoprost (XALATAN) 0.005 % ophthalmic solution 1 drop (has no administration in time range)  levobunolol (BETAGAN) 0.5 % ophthalmic solution 1 drop (has no administration in time range)  atorvastatin (LIPITOR) tablet 40 mg (40 mg Oral Not Given 07/31/17 2250)     Initial Impression / Assessment and Plan / ED Course  I have reviewed the triage vital signs and the nursing notes.  Pertinent labs & imaging results that were available during my care of the patient were reviewed by me and considered in my medical decision making (see chart for details).     72 year old female with a history of hypertension hyperlipidemia presents with concern for acute onset of headache and left-sided numbness and weakness. Code stroke activated on patient arrival. At time of my exam, patient reports improving symptoms, noted on initial exam only to have sensory abnormality, on reevaluation reports resolution of symptoms.  Acute onset of transient neuro symptoms concerning for TIA. Dr. Leonel Ramsay neurology evaluated the patient, recommends TIA work up.    Final Clinical Impressions(s) / ED Diagnoses   Final diagnoses:  TIA (transient ischemic attack)    ED Discharge Orders    None  Gareth Morgan, MD 08/01/17 [phone]

## 2017-07-31 NOTE — ED Triage Notes (Signed)
Pt reports hx of arthritis to left hand. States that today around 130pm or 2pm began having a sharp stabbing pain to left hand with subjective weakness to left arm, new numbness and tingling to left arm. Pt talking about her left hand arthritis as well but has made distinction that it felt different in feeling weak and tingling at the same time as the headache. No drift.

## 2017-07-31 NOTE — H&P (Signed)
Date: 07/31/2017               Patient Name:  Barbara Holt MRN: [id number]  DOB: 1945/11/21 Age / Sex: 72 y.o., female   PCP: [facility], [facility]         Medical Service: Internal Medicine Teaching Service         Attending Physician: Dr. Oval Linsey, MD    First Contact: Dr. Thomasene Ripple Pager: [phone]  Second Contact: Dr. Lorella Nimrod Pager: [phone]       After Hours (After 5p/  First Contact Pager: [phone]  weekends / holidays): Second Contact Pager: [phone]   Chief Complaint: L arm tingling, pain, and weakness  History of Present Illness:  Barbara Holt is a 72 yo with PMH of HLD and HTN who presents for evaluation of acute onset L arm weakness. History obtained directly from the patient. Patient was in usual state of health until the afternoon of presentation when she noticed the sudden onset of sharp, stabbing pain on her L temporal region of her head. This pain was diffusely located around the scalp and lasted a few seconds-minutes before it self resolved. The patient experienced this pain once or twice in the past, but never in this intensity and always self resolves. The pain was not associated with headache, changes in vision, and changes in speech. Patient did, however, notice some increased weakness in LUE after the head pain started. Patient states that her entire arm felt weak, like she couldn't use it. Didn't notice any changes in sensation in her extremities. Patient concerned about constellation of symptoms, so she went to ED for evaluation. Patient thinks that the pain in her upper extremity resolved around the time she got to the ED, however she continued to experience weakness in the L hand during her time in the ED.  In the 3 weeks leading up to admission patient had noticed subjective weakness in her L hand, stating that she has experienced increased difficulty with hanging clothes and gripping object as a result of this worsening function. Patient  doesn't note any joint swelling or joint pain during this time. Patient denies fever, palpitations, chest pain, shortness of breath, abdominal pain, diaphoresis, nausea/vomiting, lower extremity swelling, and change in bowel/bladder habits.  In the ED the patient was afebrile, normotensive, nontachycardic, and saturating >95% on room air. The patient's CBC was within normal limits. BMP was significant for Na of 133. PT/INR within normal limits. EKG showed normal sinus rhythm without signs of acute ischemia. Head CT showed chronic microvascular changes with no acute changes. At the time of ED provider assessment, the symptoms had resolved (within 3-4 hours of onset). Neurology was consulted in ED, who recommended admission for further workup, and IMTS was called for admission.  Meds:  Current Meds  Medication Sig  . aspirin EC 81 MG tablet Take 81 mg by mouth daily.  [name] atenolol-chlorthalidone (TENORETIC) 100-25 MG tablet Take 1 tablet by mouth every morning.  . latanoprost (XALATAN) 0.005 % ophthalmic solution Place 1 drop into both eyes daily.  [name] levobunolol (BETAGAN) 0.5 % ophthalmic solution Place 1 drop into both eyes every morning.  . potassium chloride SA (K-DUR,KLOR-CON) 20 MEQ tablet Take 1 tablet by mouth daily.  . pravastatin (PRAVACHOL) 20 MG tablet Take 20 mg by mouth daily.   Allergies: Allergies as of 07/31/2017  . (No Known Allergies)   Past Medical History: Past Medical History:  Diagnosis Date  . High cholesterol   .  Hypertension    Past Surgical History: History review. No surgical history.  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 Lives alone and independent with all ADL's. Remote smoking history of 1 pack per week for <5 years. No alcohol or illicit drugs.  Review of Systems: A complete ROS was negative except as per HPI.   Physical Exam: Blood  pressure 130/70, pulse 70, temperature 98.6 F (37 C), temperature source Oral, resp. rate 12, SpO2 97 %.  Physical Exam  Constitutional: She is oriented to person, place, and time. She appears well-developed and well-nourished. No distress.  HENT:  Mouth/Throat: Oropharynx is clear and moist. No oropharyngeal exudate.  Eyes: Pupils are equal, round, and reactive to light. Conjunctivae and EOM are normal.  Cardiovascular: Normal rate, regular rhythm and intact distal pulses. Exam reveals no friction rub.  No murmur heard. Respiratory: Effort normal. No respiratory distress. She has no wheezes. She has no rales.  GI: Soft. Bowel sounds are normal. She exhibits no distension. There is no tenderness. There is no rebound.  Musculoskeletal: She exhibits no edema (of bilateral lower extremities) or tenderness (of bilateral lower extremities).  Neurological: She is alert and oriented to person, place, and time.  Face strength and sensation intact bilaterally. Tongue midline. 5/5 bicep, tricep, and grip strength bilaterally. 5/5 hip flexion, dorsiflexion, and plantarflexion bilaterally. Gross sensation to light touch of upper and lower extremities intact bilaterally. Discrimination between light and sharp touch intact on bilateral upper extremities. RAM intact bilaterally.   Skin: Skin is warm and dry. No rash noted. She is not diaphoretic. No erythema.  Psychiatric: She has a normal mood and affect. Her behavior is normal.   EKG: personally reviewed my interpretation is regular sinus rhythm. Low voltage. No ST elevation to suggest ischemia. One PVC noted.  Assessment & Plan by Problem: Active Problems:   TIA (transient ischemic attack)  Barbara Holt is a 72 yo with PMH of HLD and HTN who presented for evaluation of LUE weakness concerning for TIA. She was admitted to the internal medicine teaching service for management, with neurology consulting. The specific problems addressed during admission  were as follows:  TIA: Symptoms of L arm weakness occurred in the few hours leading up to presentation but had largely resolved by the time she was examined by providers. Patient reports 3 week history of L hand weakness, feeling like she can't grip things like she used too. She hasn't dropped anything or noticed significant changes in functioning/ADL's since symptoms began. Today's pain unusual in that it involved entire arm. It is possible that this represents a TIA, as the patient has risk factors for stroke such as HTN and HLD. Per chart review, patient's last lipid panel on 11/12/2016 showed total cholesterol =161, HDL =53, LDL =89, with intermediate 10 year ASCVD of 10.2%. Will proceed with further workup to assess for modifiable risk factors to reduce future risk of stroke. It is also possible that the patient's L hand pain represents arthritis. This diagnosis can be considered once stroke is ruled out with further workup.  -Admit to telemetry -Neuro checks, q2 hours -F/u Brain MRI -F/u Carotid dopplers -F/u echocardiography -Hemoglobin A1C, Lipid Panel -Switch pravastatin 20 mg to high intensity statin, atorvastatin 40 mg -Daily aspirin 81 mg  HTN: Patient normotensive in ED. Permissive hypertension in setting of acute stroke.  -Resume home atenolol-chlorthalidone 100-25mg  tomorrow (after MRI and 24  hours after symptoms began)  FEN/GI: -NPO pending swallow study, Heart Healthy thereafter -No IVF, electrolytes within normal limits  VTE Prophylaxis: Lovenox daily Code Status: Full  Dispo: Admit patient to Observation with expected length of stay less than 2 midnights.  SignedThomasene Ripple, MD 07/31/2017, 6:42 PM  Pager: [phone]

## 2017-07-31 NOTE — ED Notes (Signed)
STROKE ACTIVATED @ 1536 (CT 2)

## 2017-07-31 NOTE — Consult Note (Signed)
Neurology Consultation Reason for Consult: Left-sided weakness Referring Physician: Billy Fischer, E  CC: Left-sided weakness  History is obtained from: Patient  HPI: Barbara Holt is a 72 y.o. female who was sitting in her chair earlier today when her left arm became acutely weak and numb.  She states that her symptoms started about 130, and then have been gradually improving ever since with her hands still feeling slightly weak but otherwise back to normal.  On my initial exam in the CT scanner, I did appreciate a mild left facial droop, but this seemed to be improved by the time we returned to her exam room.   LKW: 1:30 PM tpa given?: no, bili improving symptoms Premorbid modified rankin scale: 0  ROS: A 14 point ROS was performed and is negative except as noted in the HPI.  Past Medical History:  Diagnosis Date  . High cholesterol   . Hypertension      History reviewed. No pertinent family history.   Social History:  reports that she has never smoked. She has never used smokeless tobacco. She reports that she does not drink alcohol. Her drug history is not on file.   Exam: Current vital signs: BP 125/72   Pulse 66   Temp 98.6 F (37 C) (Oral)   Resp 14   SpO2 99%  Vital signs in last 24 hours: Temp:  [98.6 F (37 C)] 98.6 F (37 C) (05/04 1751) Pulse Rate:  [66-83] 66 (05/04 1915) Resp:  [12-24] 14 (05/04 1915) BP: (125-139)/(70-88) 125/72 (05/04 1915) SpO2:  [96 %-99 %] 99 % (05/04 1915)   Physical Exam  Constitutional: Appears well-developed and well-nourished.  Psych: Affect appropriate to situation Eyes: No scleral injection HENT: No OP obstrucion Head: Normocephalic.  Cardiovascular: Normal rate and regular rhythm.  Respiratory: Effort normal, non-labored breathing GI: Soft.  No distension. There is no tenderness.  Skin: WDI  Neuro: Mental Status: Patient is awake, alert, oriented to person, place, month, year, and situation. Patient is able to  give a clear and coherent history. No signs of aphasia or neglect Cranial Nerves: II: Visual Fields are full. Pupils are equal, round, and reactive to light.   III,IV, VI: EOMI without ptosis or diploplia.  V: Facial sensation is symmetric to temperature VII: Facial movement with initially mild left facial weakness, improved VIII: hearing is intact to voice X: Uvula elevates symmetrically XI: Shoulder shrug is symmetric. XII: tongue is midline without atrophy or fasciculations.  Motor: Tone is normal. Bulk is normal. 5/5 strength was present in all four extremities.  Sensory: Sensation is symmetric to light touch and temperature in the arms and legs. Cerebellar: No clear ataxia    I have reviewed labs in epic and the results pertinent to this consultation are: CMP-mild hyponatremia  I have reviewed the images obtained: CT head-unremarkable  Impression: 72 year old female with transient left arm and face weakness, now essentially back to baseline.  I suspect that this is most consistent with transient ischemic attack and would favor evaluation for such.  Recommendations: 1. HgbA1c, fasting lipid panel 2. MRI, MRA  of the brain without contrast 3. Frequent neuro checks 4. Echocardiogram 5. Carotid dopplers 6. Prophylactic therapy-Antiplatelet med: Aspirin - dose 325mg  PO or 300mg  PR 7. Risk factor modification 8. Telemetry monitoring 9. please page stroke NP  Or  PA  Or MD  from 8am -4 pm as this patient will be followed by the stroke team at this point.   You can look them up  on [email or web address]      Roland Rack, MD [facility] [phone]  If 7pm- 7am, please page neurology on call as listed in [place].

## 2017-08-01 ENCOUNTER — Other Ambulatory Visit: Payer: Self-pay | Admitting: Neurology

## 2017-08-01 ENCOUNTER — Observation Stay (HOSPITAL_BASED_OUTPATIENT_CLINIC_OR_DEPARTMENT_OTHER): Payer: Medicare Other

## 2017-08-01 DIAGNOSIS — Z7982 Long term (current) use of aspirin: Secondary | ICD-10-CM

## 2017-08-01 DIAGNOSIS — G459 Transient cerebral ischemic attack, unspecified: Secondary | ICD-10-CM

## 2017-08-01 DIAGNOSIS — I10 Essential (primary) hypertension: Secondary | ICD-10-CM | POA: Diagnosis not present

## 2017-08-01 DIAGNOSIS — G43109 Migraine with aura, not intractable, without status migrainosus: Secondary | ICD-10-CM

## 2017-08-01 DIAGNOSIS — R7303 Prediabetes: Secondary | ICD-10-CM

## 2017-08-01 DIAGNOSIS — I6529 Occlusion and stenosis of unspecified carotid artery: Secondary | ICD-10-CM

## 2017-08-01 DIAGNOSIS — Z7902 Long term (current) use of antithrombotics/antiplatelets: Secondary | ICD-10-CM

## 2017-08-01 DIAGNOSIS — Z79899 Other long term (current) drug therapy: Secondary | ICD-10-CM

## 2017-08-01 DIAGNOSIS — E7800 Pure hypercholesterolemia, unspecified: Secondary | ICD-10-CM

## 2017-08-01 DIAGNOSIS — E785 Hyperlipidemia, unspecified: Secondary | ICD-10-CM

## 2017-08-01 LAB — ECHOCARDIOGRAM COMPLETE
Height: 64 in
WEIGHTICAEL: 2423.3 [oz_av]

## 2017-08-01 LAB — VITAMIN B12: Vitamin B-12: 600 pg/mL (ref 180–914)

## 2017-08-01 LAB — HEMOGLOBIN A1C
HEMOGLOBIN A1C: 5.9 % — AB (ref 4.8–5.6)
Mean Plasma Glucose: 122.63 mg/dL

## 2017-08-01 LAB — HIV ANTIBODY (ROUTINE TESTING W REFLEX): HIV Screen 4th Generation wRfx: NONREACTIVE

## 2017-08-01 LAB — TSH: TSH: 1.042 u[IU]/mL (ref 0.350–4.500)

## 2017-08-01 LAB — LIPID PANEL
CHOL/HDL RATIO: 2.8 ratio
Cholesterol: 155 mg/dL (ref 0–200)
HDL: 56 mg/dL (ref >40)
LDL CALC: 86 mg/dL (ref 0–99)
Triglycerides: 65 mg/dL (ref <150)
VLDL: 13 mg/dL (ref 0–40)

## 2017-08-01 LAB — T4, FREE: FREE T4: 1.05 ng/dL (ref 0.82–1.77)

## 2017-08-01 LAB — RPR: RPR Ser Ql: NONREACTIVE

## 2017-08-01 MED ORDER — CLOPIDOGREL BISULFATE 75 MG PO TABS: 75.0000 mg | ORAL_TABLET | Freq: Every day | ORAL | 2 refills | Status: DC

## 2017-08-01 MED ORDER — CLOPIDOGREL BISULFATE 75 MG PO TABS: 75.0000 mg | ORAL_TABLET | Freq: Every day | ORAL | Status: DC

## 2017-08-01 MED ORDER — CLOPIDOGREL BISULFATE 300 MG PO TABS
300.0000 mg | ORAL_TABLET | Freq: Once | ORAL | Status: DC
  Filled 2017-08-01: qty 1

## 2017-08-01 MED ORDER — ATORVASTATIN CALCIUM 40 MG PO TABS: 40.0000 mg | ORAL_TABLET | Freq: Every day | ORAL | 2 refills | Status: DC

## 2017-08-01 NOTE — Discharge Instructions (Signed)
Pre-Diabetes Mellitus and Nutrition When you have diabetes (diabetes mellitus), it is very important to have healthy eating habits because your blood sugar (glucose) levels are greatly affected by what you eat and drink. While in the hospital your blood sugars suggested that you were in the pre-diabetic range. Eating healthy foods in the appropriate amounts, at about the same times every day, can help control your blood sugar and prevent you from progressing to diabetes. When you eat the appropriate foods you:  Control your blood glucose.  Lower your risk of heart disease.  Improve your blood pressure.  Reach or maintain a healthy weight.  Every person with diabetes is different, and each person has different needs for a meal plan. Your health care provider may recommend that you work with a diet and nutrition specialist (dietitian) to make a meal plan that is best for you. Your meal plan may vary depending on factors such as:  The calories you need.  The medicines you take.  Your weight.  Your blood glucose, blood pressure, and cholesterol levels.  Your activity level.  Other health conditions you have, such as heart or kidney disease.  How do carbohydrates affect me? Carbohydrates affect your blood glucose level more than any other type of food. Eating carbohydrates naturally increases the amount of glucose in your blood. Carbohydrate counting is a method for keeping track of how many carbohydrates you eat. Counting carbohydrates is important to keep your blood glucose at a healthy level, especially if you use insulin or take certain oral diabetes medicines. It is important to know how many carbohydrates you can safely have in each meal. This is different for every person. Your dietitian can help you calculate how many carbohydrates you should have at each meal and for snack. Foods that contain carbohydrates include:  Bread, cereal, rice, pasta, and crackers.  Potatoes and  corn.  Peas, beans, and lentils.  Milk and yogurt.  Fruit and juice.  Desserts, such as cakes, cookies, ice cream, and candy.  How does alcohol affect me? Alcohol can cause a sudden decrease in blood glucose (hypoglycemia), especially if you use insulin or take certain oral diabetes medicines. Hypoglycemia can be a life-threatening condition. Symptoms of hypoglycemia (sleepiness, dizziness, and confusion) are similar to symptoms of having too much alcohol. If your health care provider says that alcohol is safe for you, follow these guidelines:  Limit alcohol intake to no more than 1 drink per day for nonpregnant women and 2 drinks per day for men. One drink equals 12 oz of beer, 5 oz of wine, or 1 oz of hard liquor.  Do not drink on an empty stomach.  Keep yourself hydrated with water, diet soda, or unsweetened iced tea.  Keep in mind that regular soda, juice, and other mixers may contain a lot of sugar and must be counted as carbohydrates.  What are tips for following this plan? Reading food labels  Start by checking the serving size on the label. The amount of calories, carbohydrates, fats, and other nutrients listed on the label are based on one serving of the food. Many foods contain more than one serving per package.  Check the total grams (g) of carbohydrates in one serving. You can calculate the number of servings of carbohydrates in one serving by dividing the total carbohydrates by 15. For example, if a food has 30 g of total carbohydrates, it would be equal to 2 servings of carbohydrates.  Check the number of grams (g) of  saturated and trans fats in one serving. Choose foods that have low or no amount of these fats.  Check the number of milligrams (mg) of sodium in one serving. Most people should limit total sodium intake to less than 2,300 mg per day.  Always check the nutrition information of foods labeled as "low-fat" or "nonfat". These foods may be higher in added  sugar or refined carbohydrates and should be avoided.  Talk to your dietitian to identify your daily goals for nutrients listed on the label. Shopping  Avoid buying canned, premade, or processed foods. These foods tend to be high in fat, sodium, and added sugar.  Shop around the outside edge of the grocery store. This includes fresh fruits and vegetables, bulk grains, fresh meats, and fresh dairy. Cooking  Use low-heat cooking methods, such as baking, instead of high-heat cooking methods like deep frying.  Cook using healthy oils, such as olive, canola, or sunflower oil.  Avoid cooking with butter, cream, or high-fat meats. Meal planning  Eat meals and snacks regularly, preferably at the same times every day. Avoid going long periods of time without eating.  Eat foods high in fiber, such as fresh fruits, vegetables, beans, and whole grains. Talk to your dietitian about how many servings of carbohydrates you can eat at each meal.  Eat 4-6 ounces of lean protein each day, such as lean meat, chicken, fish, eggs, or tofu. 1 ounce is equal to 1 ounce of meat, chicken, or fish, 1 egg, or 1/4 cup of tofu.  Eat some foods each day that contain healthy fats, such as avocado, nuts, seeds, and fish. Lifestyle   Check your blood glucose regularly.  Exercise at least 30 minutes 5 or more days each week, or as told by your health care provider.  Take medicines as told by your health care provider.  Do not use any products that contain nicotine or tobacco, such as cigarettes and e-cigarettes. If you need help quitting, ask your health care provider.  Work with a Social worker or diabetes educator to identify strategies to manage stress and any emotional and social challenges. What are some questions to ask my health care provider?  Do I need to meet with a diabetes educator?  Do I need to meet with a dietitian?  What number can I call if I have questions?  When are the best times to check my  blood glucose? Where to find more information:  American Diabetes Association: diabetes.org/food-and-fitness/food  Academy of Nutrition and Dietetics: PokerClues.dk  [employer] of Diabetes and Digestive and Kidney Diseases (NIH): ContactWire.be Summary  A healthy meal plan will help you control your blood glucose and maintain a healthy lifestyle.  Working with a diet and nutrition specialist (dietitian) can help you make a meal plan that is best for you.  Keep in mind that carbohydrates and alcohol have immediate effects on your blood glucose levels. It is important to count carbohydrates and to use alcohol carefully. This information is not intended to replace advice given to you by your health care provider. Make sure you discuss any questions you have with your health care provider. Document Released: 12/11/2004 Document Revised: 04/20/2016 Document Reviewed: 04/20/2016 Elsevier Interactive Patient Education  [employer].

## 2017-08-01 NOTE — Progress Notes (Signed)
OT Cancellation Note  Patient Details Name: Barbara Holt MRN: [id number] DOB: 1945-04-23   Cancelled Treatment:    Reason Eval/Treat Not Completed: OT screened, no needs identified, will sign off  [facility], OT/L  [phone] 08/01/2017 08/01/2017, 1:03 PM

## 2017-08-01 NOTE — H&P (Signed)
Internal Medicine Attending Admission Note Date: 08/01/2017  Patient name: Barbara Holt Medical record number: [id number] Date of birth: 02-Jul-1945 Age: 72 y.o. Gender: female  I saw and evaluated the patient. I reviewed the resident's note and I agree with the resident's findings and plan as documented in the resident's note.  Chief Complaint(s): Left-sided headache, left upper extremity weakness 1 day.  History - key components related to admission:  Barbara Holt is a 72 year old woman with a history of hypertension and hyperlipidemia who presents with a one-day history of left-sided headache and left upper extremity weakness. She was in her usual state of health until the afternoon of admission when, while sitting in a chair, she noticed a sharp stabbing pain in the left temporal region of her head. This lasted seconds to minutes only. She subsequently developed left upper extremity heaviness and numbness. Although she noted a decrease in her left grip strength over the previous 3 weeks, she had never experience similar symptoms before. She therefore presented to the emergency department. Upon arrival most of her symptoms had resolved. A code stroke was called and the neurologist evaluated the patient and felt she may have had a left facial droop which resolved by the time he reassessed her. A head CT was negative for acute bleed and an MRA and MRI were essentially unremarkable for an acute infarct. Workup thus far includes a negative carotid Doppler, negative evaluation for diabetes, unremarkable telemetry overnight, and it echocardiogram that has been completed but the read is pending.  When seen on rounds the morning after admission she felt back to baseline without any residual deficits or headaches. She was interested in going home and following up in the [place] for her primary care.  Physical Exam - key components related to admission:  Vitals:   08/01/17 0200 08/01/17  0416 08/01/17 0600 08/01/17 1002  BP: (!) 129/93 122/87 118/78 132/74  Pulse: 66 65 82 71  Resp: 16 18 14    Temp: 98.4 F (36.9 C) 98.2 F (36.8 C) 98.1 F (36.7 C) 99.1 F (37.3 C)  TempSrc: Oral Oral Oral Oral  SpO2: 98% 100% 100% 98%  Weight:      Height:       Gen.: Well-developed, well-nourished, woman sitting comfortably in a chair in no acute distress. Neuro: Cranial nerve II-XII intact, strength 5/5 throughout, sensation to light touch intact throughout.  Lab results:  Basic Metabolic Panel: Recent Labs    07/31/17 1539 07/31/17 1544 07/31/17 2218  NA 133* 134*  --   K 3.5 3.5  --   CL 95* 93*  --   CO2 29  --   --   GLUCOSE 105* 99  --   BUN 11 13  --   CREATININE 0.89 0.80 0.78  CALCIUM 9.4  --   --    Liver Function Tests: Recent Labs    07/31/17 1539  AST 24  ALT 23  ALKPHOS 67  BILITOT 0.4  PROT 7.0  ALBUMIN 3.7   CBC: Recent Labs    07/31/17 1539 07/31/17 1544 07/31/17 2218  WBC 7.4  --  8.2  NEUTROABS 4.2  --   --   HGB 13.0 13.6 13.0  HCT 39.1 40.0 38.6  MCV 77.9*  --  77.0*  PLT 316  --  295   Hemoglobin A1C: Recent Labs    08/01/17 0314  HGBA1C 5.9*   Fasting Lipid Panel: Recent Labs    08/01/17 0314  CHOL 155  HDL 56  LDLCALC 86  TRIG 65  CHOLHDL 2.8   Thyroid Function Tests: Recent Labs    08/01/17 0734  TSH 1.042  FREET4 1.05   Anemia Panel: Recent Labs    08/01/17 0734  VITAMINB12 600   Coagulation: Recent Labs    07/31/17 1539  INR 1.04   Imaging results:  Mr Jodene Nam Head Wo Contrast  Result Date: 07/31/2017 CLINICAL DATA:  Initial evaluation for acute left arm weakness, numbness, now improved. EXAM: MRI HEAD WITHOUT CONTRAST MRA HEAD WITHOUT CONTRAST TECHNIQUE: Multiplanar, multiecho pulse sequences of the brain and surrounding structures were obtained without intravenous contrast. Angiographic images of the head were obtained using MRA technique without contrast. COMPARISON:  Prior CT from earlier the  same day. FINDINGS: MRI HEAD FINDINGS Brain: Cerebral volume within normal limits for age. Patchy T2/FLAIR hyperintensity within the periventricular deep white matter both cerebral hemispheres most consistent with chronic small vessel ischemic change, mild to moderate nature. Chronic microvascular changes present within the pons as well. No abnormal foci of restricted diffusion to suggest acute or subacute ischemia. Gray-white matter differentiation maintained. No areas of remote cortical infarction. No evidence for acute or chronic intracranial hemorrhage. No mass lesion, midline shift or mass effect. No hydrocephalus. No extra-axial fluid collection. Major dural sinuses grossly patent. Pituitary gland suprasellar region normal. Midline structures intact and normal. Vascular: Major intracranial vascular flow voids are well maintained. Skull and upper cervical spine: Craniocervical junction normal. Upper cervical spine within normal limits. Bone marrow signal intensity normal. No scalp soft tissue abnormality. Sinuses/Orbits: Globes and orbital soft tissues within normal limits. Mild scattered mucosal thickening within the left ethmoidal air cells. Small left maxillary sinus retention cyst. No mastoid effusion. Inner ear structures normal. Other: None. MRA HEAD FINDINGS ANTERIOR CIRCULATION: Distal cervical segments of the internal carotid arteries are widely patent with antegrade flow. Petrous, cavernous, and supraclinoid segments of the internal carotid arteries are widely patent without stenosis. Origin of the ophthalmic arteries patent bilaterally. ICA termini widely patent. A1 segments, anterior communicating artery common anterior cerebral arteries widely patent without stenosis. M1 segments widely patent without stenosis. Normal MCA bifurcations. Distal MCA branches well perfused and symmetric. Mild distal small vessel atheromatous irregularity. POSTERIOR CIRCULATION: Vertebral arteries widely patent to the  vertebrobasilar junction. Right vertebral artery slightly dominant. Posterior inferior cerebral arteries patent bilaterally. Basilar artery widely patent to its distal aspect. Superior cerebellar and posterior cerebral arteries widely patent bilaterally. No aneurysm. IMPRESSION: MRI HEAD IMPRESSION 1. No acute intracranial abnormality identified. 2. Mild to moderate chronic small vessel ischemic disease. Otherwise unremarkable brain MRI. MRA HEAD IMPRESSION 1. Mild distal small vessel atheromatous irregularity. 2. Otherwise normal intracranial MRA. No large vessel occlusion. No proximal high-grade or correctable stenosis identified. Electronically Signed   By: Jeannine Boga M.D.   On: 07/31/2017 21:45   Mr Brain Wo Contrast  Result Date: 07/31/2017 CLINICAL DATA:  Initial evaluation for acute left arm weakness, numbness, now improved. EXAM: MRI HEAD WITHOUT CONTRAST MRA HEAD WITHOUT CONTRAST TECHNIQUE: Multiplanar, multiecho pulse sequences of the brain and surrounding structures were obtained without intravenous contrast. Angiographic images of the head were obtained using MRA technique without contrast. COMPARISON:  Prior CT from earlier the same day. FINDINGS: MRI HEAD FINDINGS Brain: Cerebral volume within normal limits for age. Patchy T2/FLAIR hyperintensity within the periventricular deep white matter both cerebral hemispheres most consistent with chronic small vessel ischemic change, mild to moderate nature. Chronic microvascular changes present within the pons as well. No  abnormal foci of restricted diffusion to suggest acute or subacute ischemia. Gray-white matter differentiation maintained. No areas of remote cortical infarction. No evidence for acute or chronic intracranial hemorrhage. No mass lesion, midline shift or mass effect. No hydrocephalus. No extra-axial fluid collection. Major dural sinuses grossly patent. Pituitary gland suprasellar region normal. Midline structures intact and normal.  Vascular: Major intracranial vascular flow voids are well maintained. Skull and upper cervical spine: Craniocervical junction normal. Upper cervical spine within normal limits. Bone marrow signal intensity normal. No scalp soft tissue abnormality. Sinuses/Orbits: Globes and orbital soft tissues within normal limits. Mild scattered mucosal thickening within the left ethmoidal air cells. Small left maxillary sinus retention cyst. No mastoid effusion. Inner ear structures normal. Other: None. MRA HEAD FINDINGS ANTERIOR CIRCULATION: Distal cervical segments of the internal carotid arteries are widely patent with antegrade flow. Petrous, cavernous, and supraclinoid segments of the internal carotid arteries are widely patent without stenosis. Origin of the ophthalmic arteries patent bilaterally. ICA termini widely patent. A1 segments, anterior communicating artery common anterior cerebral arteries widely patent without stenosis. M1 segments widely patent without stenosis. Normal MCA bifurcations. Distal MCA branches well perfused and symmetric. Mild distal small vessel atheromatous irregularity. POSTERIOR CIRCULATION: Vertebral arteries widely patent to the vertebrobasilar junction. Right vertebral artery slightly dominant. Posterior inferior cerebral arteries patent bilaterally. Basilar artery widely patent to its distal aspect. Superior cerebellar and posterior cerebral arteries widely patent bilaterally. No aneurysm. IMPRESSION: MRI HEAD IMPRESSION 1. No acute intracranial abnormality identified. 2. Mild to moderate chronic small vessel ischemic disease. Otherwise unremarkable brain MRI. MRA HEAD IMPRESSION 1. Mild distal small vessel atheromatous irregularity. 2. Otherwise normal intracranial MRA. No large vessel occlusion. No proximal high-grade or correctable stenosis identified. Electronically Signed   By: Jeannine Boga M.D.   On: 07/31/2017 21:45   Ct Head Code Stroke Wo Contrast  Result Date:  07/31/2017 CLINICAL DATA:  Code stroke. Acute onset of left arm weakness and numbness. Left facial numbness. Symptoms began 2 hours ago. EXAM: CT HEAD WITHOUT CONTRAST TECHNIQUE: Contiguous axial images were obtained from the base of the skull through the vertex without intravenous contrast. COMPARISON:  None. FINDINGS: Brain: Basal ganglia are intact bilaterally. Insular ribbon is normal. No acute or focal cortical abnormalities are present. Subcortical white matter changes are present bilaterally. Brainstem and cerebellum are normal. Vascular: No acute infarct, hemorrhage, or mass lesion is present. Skull: Calvarium is intact. No focal lytic or blastic lesions are present. No significant extracranial soft tissue injuries are present. Sinuses/Orbits: A polyp or is noted posteriorly within mucous retention cyst the left maxillary sinus. The remaining paranasal sinuses and mastoid air cells are clear. Globes and orbits are within normal limits. ASPECTS [facility] Stroke Program Early CT Score) - Ganglionic level infarction (caudate, lentiform nuclei, internal capsule, insula, M1-M3 cortex): 7/7 - Supraganglionic infarction (M4-M6 cortex): 3/3 Total score (0-10 with 10 being normal): 10/10 IMPRESSION: 1. No acute intracranial abnormality. 2. Diffuse subcortical white matter changes are mildly advanced for age. These likely reflect the sequela of chronic microvascular ischemia 3. ASPECTS is 10/10 The above was relayed via text pager to Dr. Leonel Ramsay on 07/31/2017 at 15:55 . Electronically Signed   By: San Morelle M.D.   On: 07/31/2017 15:56   Other results:  EKG: Personally reviewed. Normal sinus rhythm at 90 bpm, one premature ventricular contraction, normal axis, borderline first-degree AV block, no significant Q waves, no LVH by voltage, good R wave progression, no ST or T wave changes. Unchanged from the  previous ECG on 12/11/2016.  Assessment & Plan by Problem:  Barbara Holt is a 72 year old woman  with a history of hypertension and hyperlipidemia who presents with a one-day history of left-sided headache and left upper extremity weakness.her presentation is most consistent with a transient ischemic attack. One could consider this to be in aspirin failure. She also likely requires a higher intensity statin given this episode.  1) Presumed TIA: No residual deficits. We will discontinue the aspirin and start Plavix 75 mg by mouth daily. We will also discontinue the pravastatin and start atorvastatin 40 mg by mouth daily. We encouraged her to continue with her diet and exercise. We will also continue to treat her hypertension with her current beta blocker-diuretic.  2) Disposition: She is stable for discharge home today with follow-up in the [place] where she would like to establish primary care.

## 2017-08-01 NOTE — Progress Notes (Signed)
Internal Medicine Attending  Date: 08/01/2017  Patient name: Garlene Apperson Medical record number: [id number] Date of birth: 1945-11-19 Age: 72 y.o. Gender: female  I saw and evaluated the patient. I reviewed the resident's note by Dr. Berneice Gandy and I agree with the resident's findings and plans as documented in her progress note.  Please see my H&P dated 08/01/2017 for the specifics of my evaluation, assessment, and plan from earlier in the day.

## 2017-08-01 NOTE — Discharge Summary (Signed)
Name: Barbara Holt MRN: [id number] DOB: 1945-07-15 72 y.o. PCP: [facility]  Date of Admission: 07/31/2017  3:35 PM Date of Discharge: 08/01/2017 Attending Physician: Oval Linsey, MD  Discharge Diagnosis: Active Problems:   TIA (transient ischemic attack)   Essential hypertension   Pure hypercholesterolemia  Discharge Medications: Allergies as of 08/01/2017   No Known Allergies     Medication List    STOP taking these medications   aspirin EC 81 MG tablet   pravastatin 20 MG tablet Commonly known as:  PRAVACHOL     TAKE these medications   atenolol-chlorthalidone 100-25 MG tablet Commonly known as:  TENORETIC Take 1 tablet by mouth every morning.   atorvastatin 40 MG tablet Commonly known as:  LIPITOR Take 1 tablet (40 mg total) by mouth daily at 6 PM.   clopidogrel 75 MG tablet Commonly known as:  PLAVIX Take 1 tablet (75 mg total) by mouth daily. Start taking on:  08/02/2017   latanoprost 0.005 % ophthalmic solution Commonly known as:  XALATAN Place 1 drop into both eyes daily.   levobunolol 0.5 % ophthalmic solution Commonly known as:  BETAGAN Place 1 drop into both eyes every morning.   potassium chloride SA 20 MEQ tablet Commonly known as:  K-DUR,KLOR-CON Take 1 tablet by mouth daily.       Disposition and follow-up:   Barbara Holt was discharged from [facility] in Good condition.  At the hospital follow up visit please address:  1.  Patient admitted for observation and workup after transient, self resolving LUE weakness. Workup most consistent with TIA and started on high-intensity statin along with clopidogrel for risk factor reduction. Please assess compliance with these medications.  Patient noted to have A1C = 5.9% on admission, suggesting pre-diabetes. Diet and lifestyle modifications given. Please follow up on discharge.  Patient's echocardiography revealed possible G1DD, however, patient without hx of  heart failure and signs/symptoms of HF leading up to and during admission. Consider repeat testing for this possible incidental finding.   -> Patient without PCP in [place], so referred to [facility] to establish care for her HTN and HLD. <-  2.  Labs / imaging needed at time of follow-up: None  3.  Pending labs/ test needing follow-up: HIV antibody, RPR, official read on carotid doppler ultrasound  Follow-up Appointments: Follow-up Information    [facility]. Call.   Why:  Please call this clinic to establish as a new patient after your discharge from the hospital. At this appointment please bring any old medical records and current medication bottles that you have so that the physician can go through your medical history. Contact information: [address] [place] Hospital Course by problem list: Active Problems:   TIA (transient ischemic attack)   Essential hypertension   Pure hypercholesterolemia   Barbara Holt is a 72 yo with PMH of HLD and HTN who presentedfor evaluation of LUE weakness concerning forTIA. She was admitted to the internal medicine teaching service for management, with neurology consulting. The specific problems addressed during admission were as follows:  TIA: Patient's symptoms of LUE weakness which precipitated admission self-resolved overnight and patient was back to baseline on HD#1. Patient's head imaging, including Head CT and Brain MRI/MRA, did not show evidence of acute stroke suggesting that patient's symptoms were a result of TIA. Her echocardiography did not reveal PFO and preliminary read on LE  DVT study was negative for acute clot. Her echocardiography did reveal G1DD, however patient completely asymptomatic and without signs/symptoms of volume overload. Please consider repeat testing if warranted. For the remainder of her stroke workup, the preliminary read on carotid  doppler showed 1-39% ICA plaques and LE dopplers were negative for acute/chronic DVT. Her hemoglobin A1C elevated to 5.9% on admission, suggesting pre-diabetes, and diet/lifestyle modifications recommended. Prior to admission patient was on low intensity statin, with good cholesterol control, and daily aspirin. Since she experienced TIA on these medications, her therapy was escalated to high intensity statin (atorvastatin 40 mg qD) and daily plavix for further risk factor modification. Please assess compliance with these medications and assess for side effects at hospital follow up. Patient was referred to [facility] clinic to establish care for chronic medical conditions, as she did not have local PCP on admission.  HTN: Patient normotensive throughout admission on home atenolol-chlorthalidone 100-25mg . Patient was told to continue this regimen without changes on discharge.  Discharge Vitals:   BP 132/74 (BP Location: Right Arm)   Pulse 71   Temp 99.1 F (37.3 C) (Oral)   Resp 14   Ht 5\' 4"  (1.626 m)   Wt 151 lb 7.3 oz (68.7 kg)   SpO2 98%   BMI 26.00 kg/m   Pertinent Labs, Studies, and Procedures:   BMP Latest Ref Rng & Units 07/31/2017 07/31/2017 07/31/2017  Glucose 65 - 99 mg/dL - 99 105(H)  BUN 6 - 20 mg/dL - 13 11  Creatinine 0.44 - 1.00 mg/dL 0.78 0.80 0.89  Sodium 135 - 145 mmol/L - 134(L) 133(L)  Potassium 3.5 - 5.1 mmol/L - 3.5 3.5  Chloride 101 - 111 mmol/L - 93(L) 95(L)  CO2 22 - 32 mmol/L - - 29  Calcium 8.9 - 10.3 mg/dL - - 9.4   CBC Latest Ref Rng & Units 07/31/2017 07/31/2017 07/31/2017  WBC 4.0 - 10.5 K/uL 8.2 - 7.4  Hemoglobin 12.0 - 15.0 g/dL 13.0 13.6 13.0  Hematocrit 36.0 - 46.0 % 38.6 40.0 39.1  Platelets 150 - 400 K/uL 295 - 316   Lipid Panel     Component Value Date/Time   CHOL 155 08/01/2017 0314   TRIG 65 08/01/2017 0314   HDL 56 08/01/2017 0314   CHOLHDL 2.8 08/01/2017 0314   VLDL 13 08/01/2017 0314   LDLCALC 86 08/01/2017 0314   Hemoglobin A1C= 5.9 TSH =  1.05 Free T4 = 1.05  HIV antibody/RPR = PENDING on discharge  Head CT, Code Stroke 07/31/2017 IMPRESSION: 1. No acute intracranial abnormality. 2. Diffuse subcortical white matter changes are mildly advanced for age. These likely reflect the sequela of chronic microvascular ischemia 3. ASPECTS is 10/10  MR Brain, MRA 07/31/2017 MRI HEAD IMPRESSION: 1. No acute intracranial abnormality identified. 2. Mild to moderate chronic small vessel ischemic disease. Otherwise unremarkable brain MRI.  MRA HEAD IMPRESSION: 1. Mild distal small vessel atheromatous irregularity. 2. Otherwise normal intracranial MRA. No large vessel occlusion. No proximal high-grade or correctable stenosis identified.  Echocardiography 08/01/2017 Study Conclusions: - Left ventricle: The cavity size was normal. Wall thickness was   normal. Systolic function was normal. The estimated ejection   fraction was in the range of 60% to 65%. Wall motion was normal;   there were no regional wall motion abnormalities. Doppler   parameters are consistent with abnormal left ventricular   relaxation (grade 1 diastolic dysfunction). - Atrial septum: No defect or patent foramen ovale was identified.  Vas US Carotid Duplex, Bilateral 08/01/2017  VASCULAR LAB PRELIMINARY Carotid duplex completed.   Preliminary report:  1-39% ICA plaquing. Vertebral artery flow is antegrade.   Discharge Instructions: Discharge Instructions    Call MD for:  difficulty breathing, headache or visual disturbances   Complete by:  As directed    Call MD for:  persistant dizziness or light-headedness   Complete by:  As directed    Call MD for:  persistant nausea and vomiting   Complete by:  As directed    Call MD for:  temperature >100.4   Complete by:  As directed    Diet - low sodium heart healthy   Complete by:  As directed    Discharge instructions   Complete by:  As directed    You were evaluated for left upper extremity weakness, which  self-resolved during hospitalization. Your workup was most consistent with transient ischemic attack (TIA) as a cause of your symptoms. Your symptoms were not caused by a stroke, which causes permanent damage to the brain tissue, but having a TIA puts you at risk for a stroke in the future. To reduce your future risk of stroke, we recommend that you STOP taking daily pravastatin and aspirin and START taking daily atorvastatin (Lipitor) and clopidogrel (Plavix). We also noticed that your blood sugar was slightly elevated while you were in the hospital, indicating that you are pre-diabetic or at risk for developing diabetes in the future. You are already exercising and loosing weight, both of which will help prevent diabetes in the future, but you can make simple changes to your diet to further reduce risk of diabetes. Please use the information provided on this handout to help make these dietary changes.  As discussed during your hospitalization, atorvastatin (like you previous medication pravastatin) works by decreasing the inflammation in your blood vessels and prevents future plaque buildup. Clopidogrel prevents blood clots from forming but this medication can cause GI bleeding. If you notice a change in the color/consistency of your bowel movements while taking this medication, ie your bowel movements become darker black and more tarry in nature, please notify your primary care physician.   Since you do not have a primary care physician in [place], please use the numbers provided on this paperwork to establish care in the Internal medicine Clinic on the Ground [facility] of [facility] after discharge. When you call tell them that you were recently discharged from the hospital and that you would like to establish care with the clinic.   Increase activity slowly   Complete by:  As directed      Signed: Thomasene Ripple, MD 08/01/2017, 12:52 PM   Pager: [phone]

## 2017-08-01 NOTE — Progress Notes (Signed)
VASCULAR LAB PRELIMINARY  PRELIMINARY  PRELIMINARY  PRELIMINARY  Carotid duplex completed.    Preliminary report:  1-39% ICA plaquing. Vertebral artery flow is antegrade.   Barbara Holt, RVT 08/01/2017, 9:14 AM

## 2017-08-01 NOTE — Progress Notes (Signed)
   Subjective:  Patient seen this AM sitting comfortably in bedside chair in no acute distress. Patient states that her LUE and hand weakness experienced on admission has now completely resolved and she feels back to baseline. She has no other acute complaints and is anxious to go home today.  Objective:  Vital signs in last 24 hours: Vitals:   07/31/17 2203 08/01/17 0200 08/01/17 0416 08/01/17 0600  BP: (!) 141/91 (!) 129/93 122/87 118/78  Pulse: 70 66 65 82  Resp: 18 16 18 14   Temp: 98 F (36.7 C) 98.4 F (36.9 C) 98.2 F (36.8 C) 98.1 F (36.7 C)  TempSrc: Oral Oral Oral Oral  SpO2: 100% 98% 100% 100%  Weight: 151 lb 7.3 oz (68.7 kg)     Height: 5\' 4"  (1.626 m)      Physical Exam  Constitutional: She is oriented to person, place, and time. She appears well-developed and well-nourished. No distress.  HENT:  Mouth/Throat: Oropharynx is clear and moist. No oropharyngeal exudate.  Cardiovascular: Normal rate, regular rhythm and intact distal pulses. Exam reveals no friction rub.  No murmur heard. Respiratory:  Lungs clear to auscultation bilaterally in anterior lung fields.  Musculoskeletal: She exhibits no edema (of bilateral lower extremities) or tenderness (of bilateral lower extremities).  Neurological: She is alert and oriented to person, place, and time.  Face symmetric. Tongue midline. 5/5 grip strength bilaterally. 5/5 dorsiflexion and plantarflexion bilaterally.   Skin: Skin is warm and dry. No rash noted. She is not diaphoretic. No erythema.  Psychiatric: She has a normal mood and affect. Her behavior is normal.   Assessment/Plan:  Active Problems:   TIA (transient ischemic attack)  Barbara Holt is a 72 yo with PMH of HLD and HTN who presented for evaluation of LUE weakness concerning for TIA. She was admitted to the internal medicine teaching service for management, with neurology consulting. The specific problems addressed during admission were as  follows:  TIA: Patient's symptoms of LUE weakness which precipitated admission self-resolved overnight and patient' back to baseline this AM. Patient's head imaging, including Head CT and Brain MRI/MRA, did not show evidence of acute stroke. Prelim read on carotid doppler showed 1-39% ICA plaque. Symptoms and workup thus far most consistent with TIA. Since patient experienced TIA on low intensity statin and daily aspirin 81 mg, will escalate therapy to high intensity statin and daily plavix for further risk factor modification. Patient's Hgb A1C = 5.9%, pre-diabietic range and patient already making appropriate diet/lifestyle modifications. Will provide additional resources for pre-diabetic diet on discharge as well. -F/u echocardiology ordered on admission -Atorvastatin 40 mg and clopidogrel 75 mg daily on discharge -Patient to establish in [facility] clinic upon discharge, numbers provided on d/c paperwork  HTN: Patient currently normotensive. -Resume home atenolol-chlorthalidone 100-25mg    FEN/GI: -HH diet -No IVF, electrolytes within normal limits  VTE Prophylaxis: Lovenox daily Code Status: Full  Dispo: Anticipated discharge today pending echocardiography.  Thomasene Ripple, MD 08/01/2017, 9:47 AM Pager: [phone]

## 2017-08-01 NOTE — Progress Notes (Addendum)
Neurology Progress Note Reason for Consult: Left-sided weakness Referring Physician: Billy Fischer, E  CC: Left-sided weakness  HPI: Barbara Holt is a 72 y.o. female who presented to the ER on 5/4 when she suddenly had a stabbing focal pain in her let side of head (points to just above ear).  Then her left arm became acutely weak and numb.  They gradually improving once she arrived to the ER and have now resolved.   Interval History: There has been no return of symptoms. Stoke wk up completed. Stroke education done  ROS: A 14 point ROS was performed and is negative except as noted in the HPI.   Exam: Current vital signs: BP 132/74 (BP Location: Right Arm)   Pulse 71   Temp 99.1 F (37.3 C) (Oral)   Resp 14   Ht 5\' 4"  (1.626 m)   Wt 68.7 kg (151 lb 7.3 oz)   SpO2 98%   BMI 26.00 kg/m  Vital signs in last 24 hours: Temp:  [98 F (36.7 C)-99.1 F (37.3 C)] 99.1 F (37.3 C) (05/05 1002) Pulse Rate:  [65-83] 71 (05/05 1002) Resp:  [12-24] 14 (05/05 0600) BP: (118-141)/(70-93) 132/74 (05/05 1002) SpO2:  [96 %-100 %] 98 % (05/05 1002) Weight:  [68.7 kg (151 lb 7.3 oz)] 68.7 kg (151 lb 7.3 oz) (05/04 2203)   Physical Exam  Constitutional: Appears well-developed and well-nourished.  Psych: Affect appropriate to situation Eyes: No scleral injection HENT: No OP obstrucion Head: Normocephalic.  Cardiovascular: Normal rate and regular rhythm.  Respiratory: Effort normal, non-labored breathing GI: Soft.  No distension. There is no tenderness.  Skin: WDI  Neuro: Mental Status: Patient is awake, alert, oriented to person, place, month, year, and situation. Patient is able to give a clear and coherent history. No signs of aphasia or neglect Cranial Nerves: II: Visual Fields are full. Pupils are equal, round, and reactive to light.   III,IV, VI: EOMI without ptosis or diploplia.  V: Facial sensation is symmetric to temperature VII: Facial movement full and symmetric. VIII:  hearing is intact to voice X: Uvula elevates symmetrically XI: Shoulder shrug is symmetric. XII: tongue is midline without atrophy or fasciculations.  Motor: Tone is normal. Bulk is normal. 5/5 strength was present in all four extremities.  Sensory: Sensation is symmetric to light touch and temperature in the arms and legs. Cerebellar: No clear ataxia Impression: 72 year old female with transient left arm and face weakness, now essentially back to baseline.  I suspect that this is most consistent with transient ischemic attack and would favor evaluation for such. Labs reviewed: A1C wnl, no DM2 hx. LDL 86, goal <100.  I have reviewed the images obtained:  CT head-unremarkable MRI- no acute changes MRA- neg, no LVO Lower Ext [place]- neg, no DVT CUS- no significantstenosis Echo- just done this am, awaiting final read, primary team may f/u  Recommendations/PLAN: -Stroke education  -Prophylactic therapy: Antiplatelet med: Aspirin - dose 325mg  PO daily + Lipitor 40mg  daily -Risk factor modification -We will sign off at this time  ATTENDING NOTE: I reviewed above note and agree with the assessment and plan. I have made any additions or clarifications directly to the above note. Pt was seen and examined.   72 year old female with history of hypertension, hyperlipidemia admitted for left arm weakness after a brief left sided sharp headache.  Patient denies any history of migraine.  Denies facial droop or right leg weakness.  MRI/MRA negative.  EF 60 to 60%.  Negative for DVT and  unremarkable carotid Doppler.  LDL 86 and A1c 5.9.  Episode concerning for cortical TIA, however, Barbara Holt migraine cannot be ruled out.  Patient refused 30-day cardio event monitoring or further embolic work-up, and she is eager to go home.  She has aspirin and pravastatin PTA, recommend aspirin and Plavix for 3 weeks and then Plavix alone.  Continue Lipitor 40.  Neurology will sign off. Please call with questions. Pt will  follow up with stroke clinic NP at [facility] in about 4 weeks. Thanks for the consult.   Rosalin Hawking, MD PhD Stroke Neurology 08/01/2017 4:20 PM

## 2017-08-01 NOTE — Progress Notes (Signed)
PT Cancellation Note  Patient Details Name: Barbara Holt MRN: [id number] DOB: 1945-08-15   Cancelled Treatment:    Reason Eval/Treat Not Completed: PT screened, no needs identified, will sign off. Patient symptoms have resolved. Patient independent with quick balance screen. Patient has no further questions/concerns. Thank you for the consult.  Ellamae Sia, PT, DPT Acute Rehabilitation Services  Pager: [phone]    Willy Eddy 08/01/2017, 10:42 AM

## 2017-08-01 NOTE — Progress Notes (Signed)
  Echocardiogram 2D Echocardiogram has been performed.  Jennette Dubin 08/01/2017, 8:28 AM

## 2017-08-01 NOTE — Progress Notes (Signed)
PT Cancellation Note  Patient Details Name: Barbara Holt MRN: [id number] DOB: 11/10/1945   Cancelled Treatment:    Reason Eval/Treat Not Completed: Patient at procedure or test/unavailable. Patient off unit in vascular. Will follow up.  Ellamae Sia, PT, DPT Acute Rehabilitation Services  Pager: [phone]    Willy Eddy 08/01/2017, 8:11 AM

## 2017-08-01 NOTE — Progress Notes (Signed)
VASCULAR LAB PRELIMINARY  PRELIMINARY  PRELIMINARY  PRELIMINARY  Bilateral lower extremity venous duplex completed.    Preliminary report:  There is no DVT or SVT noted in the bilateral lower extremities.   Chauna Osoria, RVT 08/01/2017, 9:15 AM

## 2017-08-25 DIAGNOSIS — H401131 Primary open-angle glaucoma, bilateral, mild stage: Secondary | ICD-10-CM | POA: Diagnosis not present

## 2017-08-25 DIAGNOSIS — H2513 Age-related nuclear cataract, bilateral: Secondary | ICD-10-CM | POA: Diagnosis not present

## 2017-09-01 ENCOUNTER — Encounter: Payer: Self-pay | Admitting: Adult Health

## 2017-09-01 ENCOUNTER — Ambulatory Visit (INDEPENDENT_AMBULATORY_CARE_PROVIDER_SITE_OTHER): Payer: Medicare Other | Admitting: Adult Health

## 2017-09-01 VITALS — BP 130/69 | HR 80 | Ht 64.0 in | Wt 152.8 lb

## 2017-09-01 DIAGNOSIS — I10 Essential (primary) hypertension: Secondary | ICD-10-CM

## 2017-09-01 DIAGNOSIS — G459 Transient cerebral ischemic attack, unspecified: Secondary | ICD-10-CM | POA: Diagnosis not present

## 2017-09-01 DIAGNOSIS — E7800 Pure hypercholesterolemia, unspecified: Secondary | ICD-10-CM

## 2017-09-01 NOTE — Progress Notes (Signed)
[name] [address] [place]. [place] [address] [phone]       OFFICE FOLLOW UP NOTE  Barbara. Barbara Holt Date of Birth:  Aug 05, 1945 Medical Record Number:  [id number]   Reason for Referral:  hospital TIA versus migraine follow up  CHIEF COMPLAINT:  Chief Complaint  Patient presents with  . Follow-up    Left side weakness follow up from hospital Pt seen by Dr. Erlinda Hong pt is alone    HPI: Barbara Holt is being seen today for initial visit in the office for TIA versus migraine on 07/31/2017. History obtained from patient and chart review. Reviewed all radiology images and labs personally.  Barbara Holt is a 72 year old female with history of hypertension, hyperlipidemia who was admitted for left arm weakness after a brief left sided sharp pain in head.  Patient denies any history of migraine.  Denies facial droop or right leg weakness.  MRI/MRA negative.  EF 60 to 60%.  Negative for DVT and unremarkable carotid Doppler.  LDL 86 and A1c 5.9.  Episode concerning for cortical TIA, however, migraine cannot be ruled out.  Patient refused 30-day cardio event monitoring or further embolic work-up, and she is eager to go home.  She has aspirin and pravastatin PTA, recommend aspirin and Plavix for 3 weeks and then Plavix alone.  Continue Lipitor 40.  Patient discharged in stable condition.  Patient is being seen today for hospital follow-up.  Overall she is doing well and denies recent left arm weakness or sharp pain in the left side of her head.  Denies any other neurological symptoms.  Patient has returned back to all previous activities.  Continues to take Plavix without side effects of bleeding or bruising.  Continues to take Lipitor without side effects of myalgias.  Blood pressure today satisfactory 130/69.  Patient has been continuing to stay active and attempting to workout daily along with eating a healthy diet.  Denies new or worsening stroke/TIA symptoms.  ROS:     14 system review of systems performed and negative with exception of none  PMH:  Past Medical History:  Diagnosis Date  . High cholesterol   . Hypertension     PSH:  Past Surgical History:  Procedure Laterality Date  . TUBAL LIGATION      Social History:  Social History   Socioeconomic History  . Marital status: Single    Spouse name: Not on file  . Number of children: Not on file  . Years of education: Not on file  . Highest education level: Not on file  Occupational History  . Not on file  Social Needs  . Financial resource strain: Not on file  . Food insecurity:    Worry: Not on file    Inability: Not on file  . Transportation needs:    Medical: Not on file    Non-medical: Not on file  Tobacco Use  . Smoking status: Never Smoker  . Smokeless tobacco: Never Used  Substance and Sexual Activity  . Alcohol use: No  . Drug use: Not Currently  . Sexual activity: Not on file  Lifestyle  . Physical activity:    Days per week: Not on file    Minutes per session: Not on file  . Stress: Not on file  Relationships  . Social connections:    Talks on phone: Not on file    Gets together: Not on file    Attends religious service: Not on file    Active  member of club or organization: Not on file    Attends meetings of clubs or organizations: Not on file    Relationship status: Not on file  . Intimate partner violence:    Fear of current or ex partner: Not on file    Emotionally abused: Not on file    Physically abused: Not on file    Forced sexual activity: Not on file  Other Topics Concern  . Not on file  Social History Narrative  . Not on file    Family History: History reviewed. No pertinent family history.  Medications:   Current Outpatient Medications on File Prior to Visit  Medication Sig Dispense Refill  . atenolol-chlorthalidone (TENORETIC) 100-25 MG tablet Take 1 tablet by mouth every morning.    [name] atorvastatin (LIPITOR) 40 MG tablet Take 1 tablet  (40 mg total) by mouth daily at 6 PM. 30 tablet 2  . clopidogrel (PLAVIX) 75 MG tablet Take 1 tablet (75 mg total) by mouth daily. 30 tablet 2  . latanoprost (XALATAN) 0.005 % ophthalmic solution Place 1 drop into both eyes daily.    [name] levobunolol (BETAGAN) 0.5 % ophthalmic solution Place 1 drop into both eyes every morning.  1  . Potassium Chloride ER 20 MEQ TBCR Take 1 tablet by mouth daily.  1   No current facility-administered medications on file prior to visit.     Allergies:  No Known Allergies   Physical Exam  Vitals:   09/01/17 1129  BP: 130/69  Pulse: 80  Weight: 152 lb 12.8 oz (69.3 kg)  Height: 5\' 4"  (1.626 m)   Body mass index is 26.23 kg/m. No exam data present  General: well developed, well nourished, pleasant elderly African-American female, seated, in no evident distress Head: head normocephalic and atraumatic.   Neck: supple with no carotid or supraclavicular bruits Cardiovascular: regular rate and rhythm, no murmurs Musculoskeletal: no deformity Skin:  no rash/petichiae Vascular:  Normal pulses all extremities  Neurologic Exam Mental Status: Awake and fully alert. Oriented to place and time. Recent and remote memory intact. Attention span, concentration and fund of knowledge appropriate. Mood and affect appropriate.  Cranial Nerves: Fundoscopic exam reveals sharp disc margins. Pupils equal, briskly reactive to light. Extraocular movements full without nystagmus. Visual fields full to confrontation. Hearing intact. Facial sensation intact. Face, tongue, palate moves normally and symmetrically.  Motor: Normal bulk and tone. Normal strength in all tested extremity muscles. Sensory.: intact to touch , pinprick , position and vibratory sensation.  Coordination: Rapid alternating movements normal in all extremities. Finger-to-nose and heel-to-shin performed accurately bilaterally. Gait and Station: Arises from chair without difficulty. Stance is normal. Gait  demonstrates normal stride length and balance . Able to heel, toe and tandem walk without difficulty.  Reflexes: 1+ and symmetric. Toes downgoing.    NIHSS  0 Modified Rankin  0   Diagnostic Data (Labs, Imaging, Testing)  CT HEAD WO CONTRAST 07/31/2017 IMPRESSION: 1. No acute intracranial abnormality. 2. Diffuse subcortical white matter changes are mildly advanced for age. These likely reflect the sequela of chronic microvascular ischemia 3. ASPECTS is 10/10  MR BRAIN WO CONTRAST MR MRA HEAD WO CONTRAST 07/31/2017 IMPRESSION: MRI HEAD IMPRESSION 1. No acute intracranial abnormality identified. 2. Mild to moderate chronic small vessel ischemic disease. Otherwise unremarkable brain MRI. MRA HEAD IMPRESSION 1. Mild distal small vessel atheromatous irregularity. 2. Otherwise normal intracranial MRA. No large vessel occlusion. No proximal high-grade or correctable stenosis identified.  ECHOCARDIOGRAM 08/01/17 Study Conclusions - Left  ventricle: The cavity size was normal. Wall thickness was   normal. Systolic function was normal. The estimated ejection   fraction was in the range of 60% to 65%. Wall motion was normal;   there were no regional wall motion abnormalities. Doppler   parameters are consistent with abnormal left ventricular   relaxation (grade 1 diastolic dysfunction). - Atrial septum: No defect or patent foramen ovale was identified.  VAS US CAROTID DUPLEX BILATERAL 08/01/2017 Final Interpretation: Right Carotid: The extracranial vessels were near-normal with only minimal wall        thickening or plaque. Left Carotid: The extracranial vessels were near-normal with only minimal wall       thickening or plaque. Vertebrals: Bilateral vertebral arteries demonstrate antegrade flow. Subclavians: Normal flow hemodynamics were seen in bilateral subclavian       arteries.  VAS [place] LOWER EXTREMITY VENOUS BILAT (DVT) 08/01/2017 Final  Interpretation: Right: There is no evidence of deep vein thrombosis in the lower extremity.There is no evidence of superficial venous thrombosis. Left: There is no evidence of deep vein thrombosis in the lower extremity.There is no evidence of superficial venous thrombosis.      ASSESSMENT: Linzee Depaul is a 72 y.o. year old female here with TIA versus migraine on 07/31/2017. Vascular risk factors include HTN and HLD.     PLAN: -Continue clopidogrel 75 mg daily  and Lipitor for secondary stroke prevention -F/u with PCP regarding your HLD and HTN management -Advised patient to continue to stay active and eat a healthy diet -Maintain strict control of hypertension with blood pressure goal below 130/90, diabetes with hemoglobin A1c goal below 6.5% and cholesterol with LDL cholesterol (bad cholesterol) goal below 70 mg/dL. I also advised the patient to eat a healthy diet with plenty of whole grains, cereals, fruits and vegetables, exercise regularly and maintain ideal body weight.  Follow up in 4 months or call earlier if needed   Greater than 50% of time during this 25 minute visit was spent on counseling,explanation of diagnosis of TIA versus migraine, reviewing risk factor management of HLD and HTN, planning of further management, discussion with patient and family and coordination of care    Venancio Poisson, [facility]  [facility] [address] [place], [place] [address]  Phone [phone] Fax [phone]

## 2017-09-01 NOTE — Patient Instructions (Addendum)
Continue clopidogrel 75 mg daily  and lipitor  for secondary stroke prevention  Continue to follow up with PCP regarding cholesterol and blood pressure management  Continue to follow up with eye doctor for eye drops   Continue to stay active and eat healthy  Maintain strict control of hypertension with blood pressure goal below 130/90, diabetes with hemoglobin A1c goal below 6.5% and cholesterol with LDL cholesterol (bad cholesterol) goal below 70 mg/dL. I also advised the patient to eat a healthy diet with plenty of whole grains, cereals, fruits and vegetables, exercise regularly and maintain ideal body weight.  Followup in the future with me in 4 months or call earlier if needed         Thank you for coming to see [place] at [facility]. I hope we have been able to provide you high quality care today.  You may receive a patient satisfaction survey over the next few weeks. We would appreciate your feedback and comments so that we may continue to improve ourselves and the health of our patients.

## 2017-09-03 NOTE — Progress Notes (Signed)
I agree with the above plan 

## 2017-09-10 ENCOUNTER — Encounter: Payer: Self-pay | Admitting: Internal Medicine

## 2017-09-10 ENCOUNTER — Ambulatory Visit (INDEPENDENT_AMBULATORY_CARE_PROVIDER_SITE_OTHER): Payer: Medicare Other | Admitting: Internal Medicine

## 2017-09-10 VITALS — BP 121/79 | HR 78 | Temp 98.4°F | Wt 152.5 lb

## 2017-09-10 DIAGNOSIS — J3089 Other allergic rhinitis: Secondary | ICD-10-CM | POA: Diagnosis not present

## 2017-09-10 DIAGNOSIS — J302 Other seasonal allergic rhinitis: Secondary | ICD-10-CM | POA: Diagnosis not present

## 2017-09-10 DIAGNOSIS — E663 Overweight: Secondary | ICD-10-CM | POA: Diagnosis not present

## 2017-09-10 DIAGNOSIS — K5730 Diverticulosis of large intestine without perforation or abscess without bleeding: Secondary | ICD-10-CM

## 2017-09-10 DIAGNOSIS — I519 Heart disease, unspecified: Secondary | ICD-10-CM | POA: Diagnosis not present

## 2017-09-10 DIAGNOSIS — I10 Essential (primary) hypertension: Secondary | ICD-10-CM

## 2017-09-10 DIAGNOSIS — I5189 Other ill-defined heart diseases: Secondary | ICD-10-CM

## 2017-09-10 DIAGNOSIS — K219 Gastro-esophageal reflux disease without esophagitis: Secondary | ICD-10-CM

## 2017-09-10 DIAGNOSIS — H4010X0 Unspecified open-angle glaucoma, stage unspecified: Secondary | ICD-10-CM

## 2017-09-10 DIAGNOSIS — E7800 Pure hypercholesterolemia, unspecified: Secondary | ICD-10-CM | POA: Diagnosis not present

## 2017-09-10 DIAGNOSIS — Z0000 Encounter for general adult medical examination without abnormal findings: Secondary | ICD-10-CM | POA: Insufficient documentation

## 2017-09-10 DIAGNOSIS — Z8673 Personal history of transient ischemic attack (TIA), and cerebral infarction without residual deficits: Secondary | ICD-10-CM

## 2017-09-10 DIAGNOSIS — H409 Unspecified glaucoma: Secondary | ICD-10-CM

## 2017-09-10 DIAGNOSIS — K642 Third degree hemorrhoids: Secondary | ICD-10-CM

## 2017-09-10 HISTORY — DX: Other ill-defined heart diseases: I51.89

## 2017-09-10 HISTORY — DX: Diverticulosis of large intestine without perforation or abscess without bleeding: K57.30

## 2017-09-10 HISTORY — DX: Other seasonal allergic rhinitis: J30.2

## 2017-09-10 HISTORY — DX: Third degree hemorrhoids: K64.2

## 2017-09-10 HISTORY — DX: Unspecified glaucoma: H40.9

## 2017-09-10 HISTORY — DX: Gastro-esophageal reflux disease without esophagitis: K21.9

## 2017-09-10 HISTORY — DX: Overweight: E66.3

## 2017-09-10 MED ORDER — ATENOLOL-CHLORTHALIDONE 100-25 MG PO TABS: 1.0000 | ORAL_TABLET | ORAL | 3 refills | Status: DC

## 2017-09-10 MED ORDER — POTASSIUM CHLORIDE ER 20 MEQ PO TBCR: 1.0000 | EXTENDED_RELEASE_TABLET | Freq: Every day | ORAL | 3 refills | Status: DC

## 2017-09-10 MED ORDER — ATORVASTATIN CALCIUM 40 MG PO TABS: 40.0000 mg | ORAL_TABLET | Freq: Every day | ORAL | 3 refills | Status: DC

## 2017-09-10 MED ORDER — CLOPIDOGREL BISULFATE 75 MG PO TABS: 75.0000 mg | ORAL_TABLET | Freq: Every day | ORAL | 3 refills | Status: DC

## 2017-09-10 NOTE — Assessment & Plan Note (Signed)
Assessment  Her blood pressure today was at target at 121/79. This is on atenolol-chlorthalidone 100-25 mg 1 tablet every morning. She is also on potassium chloride 20 mEq by mouth daily.  Plan  We will continue the atenolol-chlorthalidone 100-25 mg 1 tablet every morning and potassium chloride 20 mEq by mouth daily. We will reassess the blood pressure at the follow-up visit.

## 2017-09-10 NOTE — Assessment & Plan Note (Signed)
Assessment  Her perennial allergic rhinitis does have some seasonal variation. That said, her symptoms are very mild and she does not feel are worthy of pharmacologic intervention.  She was previously on Flonase which she did not like.  Plan  We will continue to monitor expectantly and if she were to change her mind on pharmacologic management of her perennial allergic rhinitis she can call the clinic for initiation of antihistamine therapy.

## 2017-09-10 NOTE — Assessment & Plan Note (Signed)
Assessment  She states she no longer has gastroesophageal reflux symptoms since cutting out tomato products.  Plan  She was encouraged to continue with this dietary modification to address her gastroesophageal reflux disease. We will reassess for a recurrence of symptoms at the follow-up visit.

## 2017-09-10 NOTE — Assessment & Plan Note (Signed)
Assessment  Her usual weight is 145 pounds where she feels very comfortable. This is her target weight. She has never been this heavy.  She currently is at 152 pounds. She believe this is mainly related to a lack of exercise. She has begun to walk, use an exercise bike, and do other activities now that the weather is improved.  Plan  She was praised for her desire to become more active and was encouraged to continue to exercise using the bicycle, walking, and other activities. We will reassess her weight at the follow-up visit. I suspect if her weight improves we may be able to de-escalate her antihypertensive regimen.

## 2017-09-10 NOTE — Assessment & Plan Note (Signed)
Assessment  Her glaucoma is followed closely by her long time ophthalmologist in [facility].  She has been treated with latanoprost and levobunolol.  Plan  She will continue with these eyedrops and follow-up with her ophthalmologist in [facility].

## 2017-09-10 NOTE — Progress Notes (Signed)
   Subjective:    Patient ID: Barbara Holt, female    DOB: [birth year], 72 y.o.   MRN: [id number]  HPI  Barbara Holt is here for follow-up of her essential hypertension, hyperlipidemia, glaucoma, recent TIA, and overweight status. Please see the A&P for the status of the pt's chronic medical problems.  Review of Systems  Constitutional: Negative for activity change, appetite change, fatigue and unexpected weight change.  HENT: Positive for congestion and voice change.   Eyes: Negative for discharge, redness, itching and visual disturbance.  Respiratory: Negative for cough, chest tightness, shortness of breath, wheezing and stridor.   Cardiovascular: Negative for chest pain, palpitations and leg swelling.  Gastrointestinal: Negative for abdominal distention, abdominal pain, constipation, diarrhea, nausea and vomiting.  Genitourinary: Negative for difficulty urinating and dysuria.  Musculoskeletal: Negative for arthralgias, back pain, gait problem, joint swelling, myalgias, neck pain and neck stiffness.  Skin: Negative for color change, rash and wound.  Allergic/Immunologic: Negative for food allergies.  Neurological: Negative for dizziness, tremors, syncope, speech difficulty, weakness, light-headedness, numbness and headaches.  Psychiatric/Behavioral: Negative for agitation, confusion, dysphoric mood and sleep disturbance. The patient is not nervous/anxious.       Objective:   Physical Exam  Constitutional: She is oriented to person, place, and time. She appears well-developed and well-nourished. No distress.  HENT:  Head: Normocephalic and atraumatic.  Eyes: Conjunctivae are normal. Right eye exhibits no discharge. Left eye exhibits no discharge. No scleral icterus.  Neck: Normal range of motion. Neck supple.  Cardiovascular: Normal rate, regular rhythm and normal heart sounds. Exam reveals no gallop and no friction rub.  No murmur heard. Pulmonary/Chest: Effort normal and breath  sounds normal. No stridor. No respiratory distress. She has no wheezes. She has no rales.  Abdominal: Soft. Bowel sounds are normal. She exhibits no distension. There is no tenderness. There is no rebound and no guarding.  Musculoskeletal: Normal range of motion. She exhibits no edema, tenderness or deformity.  Lymphadenopathy:    She has no cervical adenopathy.  Neurological: She is alert and oriented to person, place, and time. She exhibits normal muscle tone. Coordination normal.  Skin: Skin is warm and dry. No rash noted. She is not diaphoretic. No erythema.  Psychiatric: She has a normal mood and affect. Her behavior is normal. Judgment and thought content normal.  Nursing note and vitals reviewed.     Assessment & Plan:   Please see problem based charting.

## 2017-09-10 NOTE — Assessment & Plan Note (Signed)
Assessment  Asymptomatic grade 1 diastolic dysfunction which we will treat by risk factor modification, specifically aggressive management of her hypertension and hyperlipidemia. We are also working on her weight via exercise.  Plan  We will reassess if she has evidence of symptomatic diastolic dysfunction at the follow-up visit.

## 2017-09-10 NOTE — Assessment & Plan Note (Deleted)
Presented with transient left upper extremity weakness.  Standard inpatient evaluation unremarkable.  Considered asprin failure.

## 2017-09-10 NOTE — Assessment & Plan Note (Signed)
Assessment  Since discharge last month she has had no more episodes of left upper extremity heaviness, weakness anywhere else, or numbness. She was recently seen by neurology and the aspirin was discontinued. She was maintained on Plavix 75 mg by mouth daily. Their only other recommendation was risk factor modification.  Plan  We will continue the Plavix 75 mg by mouth daily. We will also aggressively treat her essential hypertension and hyperlipidemia. Her blood pressure is at target currently and she is on a high intensity statin. We will reassess for evidence of a transient ischemic attack or cerebrovascular accident at the follow-up visit.

## 2017-09-10 NOTE — Assessment & Plan Note (Signed)
Assessment  She is tolerating the atorvastatin 40 mg by mouth daily without myalgias.  Plan  We will continue with this high intensity statin and reassess for evidence of intolerances at the follow-up visit.

## 2017-09-10 NOTE — Assessment & Plan Note (Signed)
At the follow-up visit we will address many of her health care maintenance issues.

## 2017-09-10 NOTE — Patient Instructions (Signed)
It was nice to see you again.  You are taking great care of yourself.  1) Keep taking the medications like you are.  I renewed all of them for 3 months at a time for 1 year.  2) Work on walking with the improved weather.  We are shooting for a weight of 145 pounds.  3) If you can get to your weight goal with exercise we will work on trying to lower/stop your blood pressure medications.  I will see you back in 1 year, sooner if necessary.

## 2017-12-22 DIAGNOSIS — H401131 Primary open-angle glaucoma, bilateral, mild stage: Secondary | ICD-10-CM | POA: Diagnosis not present

## 2017-12-22 DIAGNOSIS — H2513 Age-related nuclear cataract, bilateral: Secondary | ICD-10-CM | POA: Diagnosis not present

## 2018-01-03 ENCOUNTER — Encounter: Payer: Self-pay | Admitting: Adult Health

## 2018-01-03 ENCOUNTER — Ambulatory Visit (INDEPENDENT_AMBULATORY_CARE_PROVIDER_SITE_OTHER): Payer: Medicare Other | Admitting: Adult Health

## 2018-01-03 VITALS — BP 126/73 | HR 63 | Ht 64.0 in | Wt 152.0 lb

## 2018-01-03 DIAGNOSIS — I10 Essential (primary) hypertension: Secondary | ICD-10-CM | POA: Diagnosis not present

## 2018-01-03 DIAGNOSIS — E7800 Pure hypercholesterolemia, unspecified: Secondary | ICD-10-CM | POA: Diagnosis not present

## 2018-01-03 DIAGNOSIS — G459 Transient cerebral ischemic attack, unspecified: Secondary | ICD-10-CM

## 2018-01-03 NOTE — Progress Notes (Signed)
I agree with the above plan 

## 2018-01-03 NOTE — Patient Instructions (Addendum)
Continue clopidogrel 75 mg daily  and lipitor  for secondary stroke prevention  Continue to follow up with PCP for continued management of cholesterol and blood pressure  Continue to stay active and maintain a healthy diet  Maintain strict control of hypertension with blood pressure goal below 130/90, diabetes with hemoglobin A1c goal below 6.5% and cholesterol with LDL cholesterol (bad cholesterol) goal below 70 mg/dL. I also advised the patient to eat a healthy diet with plenty of whole grains, cereals, fruits and vegetables, exercise regularly and maintain ideal body weight.  Followup in the future with me as needed or call earlier if needed       Thank you for coming to see [place] at [facility]. I hope we have been able to provide you high quality care today.  You may receive a patient satisfaction survey over the next few weeks. We would appreciate your feedback and comments so that we may continue to improve ourselves and the health of our patients.

## 2018-01-03 NOTE — Progress Notes (Signed)
[name] [address] [place]. [place] [address] [phone]       OFFICE FOLLOW UP NOTE  Barbara. Kaylen Motl Date of Birth:  June 02, 1945 Medical Record Number:  [id number]   Reason for visit: TIA versus migraine follow up  CHIEF COMPLAINT:  Chief Complaint  Patient presents with  . Follow-up    TIA; been doing fine, denies any TIA or stroke symptoms since last visit  . Room 9    She is here alone     HPI: Barbara Holt is being seen today in the office for TIA versus migraine on 07/31/2017. History obtained from patient and chart review. Reviewed all radiology images and labs personally.  Barbara Holt is a 72 year old female with history of hypertension, hyperlipidemia who was admitted for left arm weakness after a brief left sided sharp pain in head.  Patient denies any history of migraine.  Denies facial droop or right leg weakness.  MRI/MRA negative.  EF 60 to 60%.  Negative for DVT and unremarkable carotid Doppler.  LDL 86 and A1c 5.9.  Episode concerning for cortical TIA, however, migraine cannot be ruled out.  Patient refused 30-day cardio event monitoring or further embolic work-up, and she is eager to go home.  She has aspirin and pravastatin PTA, recommend aspirin and Plavix for 3 weeks and then Plavix alone.  Continue Lipitor 40.  Patient discharged in stable condition.  09/01/2017 visit: Patient is being seen today for hospital follow-up.  Overall she is doing well and denies recent left arm weakness or sharp pain in the left side of her head.  Denies any other neurological symptoms.  Patient has returned back to all previous activities.  Continues to take Plavix without side effects of bleeding or bruising.  Continues to take Lipitor without side effects of myalgias.  Blood pressure today satisfactory 130/69.  Patient has been continuing to stay active and attempting to workout daily along with eating a healthy diet.  Denies new or worsening stroke/TIA  symptoms.  Interval history 01/03/2018: Patient is being seen today for scheduled follow-up visit.  She states overall she is been doing well without recurrent symptoms of upper extremity weakness or headache.  She states she has been keeping active along with maintaining a healthy diet.  She continues to take Plavix without side effects of bleeding or bruising.  Continues to take Lipitor without side effects myalgias.  Blood pressure today satisfactory at 126/73.  Denies new or worsening stroke/TIA symptoms.    ROS:   14 system review of systems performed and negative with exception of none  PMH:  Past Medical History:  Diagnosis Date  . Diverticulosis of colon 09/10/2017   Descending and sigmoid colon found on colonoscopy 12/13/2014.  [name] Essential hypertension   . Gastroesophageal reflux disease 09/10/2017   Worsened by tomato products.  . Glaucoma 09/10/2017   Followed by ophthalmologist in [facility].  . Grade I diastolic dysfunction 5/99/3570   Per Echo 08/01/2017.  Asymptomatic.  [name] History of transient ischemic attack involving anterior circulation 07/31/2017   Presented with transient left upper extremity weakness.  Standard inpatient evaluation unremarkable.  Considered asprin failure.  [name] Overweight (BMI 25.0-29.9) 09/10/2017  . Perennial allergic rhinitis with seasonal variation 09/10/2017   Mildly symptomatic.  [name] Pure hypercholesterolemia   . Third degree hemorrhoids 09/10/2017    PSH:  Past Surgical History:  Procedure Laterality Date  . TUBAL LIGATION  1980    Social History:  Social History  Socioeconomic History  . Marital status: Divorced    Spouse name: Not on file  . Number of children: 2  . Years of education: Not on file  . Highest education level: Associate degree: academic program  Occupational History  . Occupation: Regulatory affairs officer    Comment: Retired  . Occupation: Print production planner    Comment: Retired  . Occupation: Surveyor, minerals    Comment: Retired  Photographer  . Financial resource strain: Not on file  . Food insecurity:    Worry: Not on file    Inability: Not on file  . Transportation needs:    Medical: Not on file    Non-medical: Not on file  Tobacco Use  . Smoking status: Former Smoker    Years: 5.00    Types: Cigarettes    Last attempt to quit: 06/28/1974    Years since quitting: 43.5  . Smokeless tobacco: Never Used  Substance and Sexual Activity  . Alcohol use: No  . Drug use: Never  . Sexual activity: Not Currently    Partners: Male  Lifestyle  . Physical activity:    Days per week: 5 days    Minutes per session: 30 min  . Stress: Not at all  Relationships  . Social connections:    Talks on phone: More than three times a week    Gets together: More than three times a week    Attends religious service: More than 4 times per year    Active member of club or organization: Yes    Attends meetings of clubs or organizations: More than 4 times per year    Relationship status: Widowed  . Intimate partner violence:    Fear of current or ex partner: No    Emotionally abused: No    Physically abused: No    Forced sexual activity: No  Other Topics Concern  . Not on file  Social History Narrative   Lives alone in [place].  No pets. Originally from [place], [place].  Husband died of colon cancer. Remarried then divorced.    Right handed    Caffeine: coffee, 1 cup daily, maybe an additional cup here and there    Family History:  Family History  Problem Relation Age of Onset  . Kidney disease Mother        Required hemodialysis, died at the age of 64  . Lung cancer Father        Never smoked  . Hypertension Sister   . Colon cancer Brother   . Lymphoma Sister   . COPD Sister   . Colon cancer Brother   . COPD Brother   . Colon cancer Brother   . Asthma Brother   . Healthy Daughter   . Healthy Son     Medications:   Current Outpatient Medications on File Prior to Visit  Medication Sig Dispense Refill    . atenolol-chlorthalidone (TENORETIC) 100-25 MG tablet Take 1 tablet by mouth every morning. 90 tablet 3  . atorvastatin (LIPITOR) 40 MG tablet Take 1 tablet (40 mg total) by mouth daily at 6 PM. 90 tablet 3  . clopidogrel (PLAVIX) 75 MG tablet Take 1 tablet (75 mg total) by mouth daily. 90 tablet 3  . latanoprost (XALATAN) 0.005 % ophthalmic solution Place 1 drop into both eyes daily.    [name] levobunolol (BETAGAN) 0.5 % ophthalmic solution Place 1 drop into both eyes every morning.  1  . Potassium Chloride ER 20 MEQ TBCR Take 1  tablet by mouth daily. 90 tablet 3   No current facility-administered medications on file prior to visit.     Allergies:  No Known Allergies   Physical Exam  Vitals:   01/03/18 1122  BP: 126/73  Pulse: 63  Weight: 152 lb (68.9 kg)  Height: 5\' 4"  (1.626 m)   Body mass index is 26.09 kg/m. No exam data present  General: well developed, well nourished, pleasant elderly African-American female, seated, in no evident distress Head: head normocephalic and atraumatic.   Neck: supple with no carotid or supraclavicular bruits Cardiovascular: regular rate and rhythm, no murmurs Musculoskeletal: no deformity Skin:  no rash/petichiae Vascular:  Normal pulses all extremities  Neurologic Exam Mental Status: Awake and fully alert. Oriented to place and time. Recent and remote memory intact. Attention span, concentration and fund of knowledge appropriate. Mood and affect appropriate.  Cranial Nerves: Fundoscopic exam reveals sharp disc margins. Pupils equal, briskly reactive to light. Extraocular movements full without nystagmus. Visual fields full to confrontation. Hearing intact. Facial sensation intact. Face, tongue, palate moves normally and symmetrically.  Motor: Normal bulk and tone. Normal strength in all tested extremity muscles. Sensory.: intact to touch , pinprick , position and vibratory sensation.  Coordination: Rapid alternating movements normal in all  extremities. Finger-to-nose and heel-to-shin performed accurately bilaterally. Gait and Station: Arises from chair without difficulty. Stance is normal. Gait demonstrates normal stride length and balance . Able to heel, toe and tandem walk without difficulty.  Reflexes: 1+ and symmetric. Toes downgoing.    NIHSS  0 Modified Rankin  0   Diagnostic Data (Labs, Imaging, Testing)  CT HEAD WO CONTRAST 07/31/2017 IMPRESSION: 1. No acute intracranial abnormality. 2. Diffuse subcortical white matter changes are mildly advanced for age. These likely reflect the sequela of chronic microvascular ischemia 3. ASPECTS is 10/10  MR BRAIN WO CONTRAST MR MRA HEAD WO CONTRAST 07/31/2017 IMPRESSION: MRI HEAD IMPRESSION 1. No acute intracranial abnormality identified. 2. Mild to moderate chronic small vessel ischemic disease. Otherwise unremarkable brain MRI. MRA HEAD IMPRESSION 1. Mild distal small vessel atheromatous irregularity. 2. Otherwise normal intracranial MRA. No large vessel occlusion. No proximal high-grade or correctable stenosis identified.  ECHOCARDIOGRAM 08/01/17 Study Conclusions - Left ventricle: The cavity size was normal. Wall thickness was   normal. Systolic function was normal. The estimated ejection   fraction was in the range of 60% to 65%. Wall motion was normal;   there were no regional wall motion abnormalities. Doppler   parameters are consistent with abnormal left ventricular   relaxation (grade 1 diastolic dysfunction). - Atrial septum: No defect or patent foramen ovale was identified.  VAS US CAROTID DUPLEX BILATERAL 08/01/2017 Final Interpretation: Right Carotid: The extracranial vessels were near-normal with only minimal wall        thickening or plaque. Left Carotid: The extracranial vessels were near-normal with only minimal wall       thickening or plaque. Vertebrals: Bilateral vertebral arteries demonstrate antegrade flow. Subclavians:  Normal flow hemodynamics were seen in bilateral subclavian       arteries.  VAS [place] LOWER EXTREMITY VENOUS BILAT (DVT) 08/01/2017 Final Interpretation: Right: There is no evidence of deep vein thrombosis in the lower extremity.There is no evidence of superficial venous thrombosis. Left: There is no evidence of deep vein thrombosis in the lower extremity.There is no evidence of superficial venous thrombosis.      ASSESSMENT: Barbara Holt is a 72 y.o. year old female here with TIA versus migraine on 07/31/2017. Vascular risk  factors include HTN and HLD.  Patient is being seen today for follow-up visit and overall has been doing well without recurring TIA versus migraine symptoms.    PLAN: -Continue clopidogrel 75 mg daily  and Lipitor for secondary stroke prevention -F/u with PCP regarding your HLD and HTN management -Advised patient to continue to stay active and eat a healthy diet -Maintain strict control of hypertension with blood pressure goal below 130/90, diabetes with hemoglobin A1c goal below 6.5% and cholesterol with LDL cholesterol (bad cholesterol) goal below 70 mg/dL. I also advised the patient to eat a healthy diet with plenty of whole grains, cereals, fruits and vegetables, exercise regularly and maintain ideal body weight.  Follow up as needed as stable from TIA standpoint but advised to call with questions, concerns or need of future follow-up appointment.   Greater than 50% of time during this 25 minute visit was spent on counseling,explanation of diagnosis of TIA versus migraine, reviewing risk factor management of HLD and HTN, planning of further management, discussion with patient and family and coordination of care    Venancio Poisson, [facility]  [facility] [address] [place], [place] [address]  Phone [phone] Fax [phone]

## 2018-03-19 ENCOUNTER — Other Ambulatory Visit: Payer: Self-pay

## 2018-03-19 ENCOUNTER — Encounter (HOSPITAL_COMMUNITY): Payer: Self-pay | Admitting: Emergency Medicine

## 2018-03-19 ENCOUNTER — Emergency Department (HOSPITAL_COMMUNITY)
Admission: EM | Admit: 2018-03-19 | Discharge: 2018-03-19 | Disposition: A | Discharge status: Home / Self Care | Payer: Medicare Other | Attending: Emergency Medicine | Admitting: Emergency Medicine

## 2018-03-19 DIAGNOSIS — R202 Paresthesia of skin: Secondary | ICD-10-CM

## 2018-03-19 DIAGNOSIS — I5032 Chronic diastolic (congestive) heart failure: Secondary | ICD-10-CM | POA: Diagnosis not present

## 2018-03-19 DIAGNOSIS — R200 Anesthesia of skin: Secondary | ICD-10-CM | POA: Diagnosis not present

## 2018-03-19 DIAGNOSIS — Z87891 Personal history of nicotine dependence: Secondary | ICD-10-CM | POA: Diagnosis not present

## 2018-03-19 DIAGNOSIS — E7800 Pure hypercholesterolemia, unspecified: Secondary | ICD-10-CM | POA: Diagnosis not present

## 2018-03-19 DIAGNOSIS — Z79899 Other long term (current) drug therapy: Secondary | ICD-10-CM | POA: Insufficient documentation

## 2018-03-19 DIAGNOSIS — I10 Essential (primary) hypertension: Secondary | ICD-10-CM | POA: Diagnosis not present

## 2018-03-19 LAB — CBG MONITORING, ED: GLUCOSE-CAPILLARY: 92 mg/dL (ref 70–99)

## 2018-03-19 NOTE — ED Provider Notes (Signed)
[facility] EMERGENCY DEPARTMENT Provider Note   CSN: [id number] Arrival date & time: 03/19/18  0354     History   Chief Complaint No chief complaint on file.   HPI Barbara Holt is a 72 y.o. female.  The history is provided by the patient.  Neurologic Problem  This is a recurrent problem. The current episode started more than 1 week ago. The problem occurs every several days. Progression since onset: waxing and wanin. Pertinent negatives include no chest pain, no abdominal pain, no headaches and no shortness of breath. Associated symptoms comments: Left hand numbness/tingling. Exacerbated by: activities. The symptoms are relieved by rest (warming hand). She has tried a warm compress for the symptoms. The treatment provided mild relief.    Past Medical History:  Diagnosis Date  . Diverticulosis of colon 09/10/2017   Descending and sigmoid colon found on colonoscopy 12/13/2014.  [name] Essential hypertension   . Gastroesophageal reflux disease 09/10/2017   Worsened by tomato products.  . Glaucoma 09/10/2017   Followed by ophthalmologist in [facility].  . Grade I diastolic dysfunction 6/56/8127   Per Echo 08/01/2017.  Asymptomatic.  [name] History of transient ischemic attack involving anterior circulation 07/31/2017   Presented with transient left upper extremity weakness.  Standard inpatient evaluation unremarkable.  Considered asprin failure.  [name] Overweight (BMI 25.0-29.9) 09/10/2017  . Perennial allergic rhinitis with seasonal variation 09/10/2017   Mildly symptomatic.  [name] Pure hypercholesterolemia   . Third degree hemorrhoids 09/10/2017    Patient Active Problem List   Diagnosis Date Noted  . Grade I diastolic dysfunction 51/70/0174  . Overweight (BMI 25.0-29.9) 09/10/2017  . Gastroesophageal reflux disease 09/10/2017  . Diverticulosis of colon 09/10/2017  . Third degree hemorrhoids 09/10/2017  . Perennial allergic rhinitis with seasonal variation 09/10/2017  .  Glaucoma 09/10/2017  . Healthcare maintenance 09/10/2017  . Essential hypertension   . Pure hypercholesterolemia   . History of transient ischemic attack involving anterior circulation 07/31/2017    Past Surgical History:  Procedure Laterality Date  . TUBAL LIGATION  1980     OB History   No obstetric history on file.      Home Medications    Prior to Admission medications   Medication Sig Start Date End Date Taking? Authorizing Provider  atenolol-chlorthalidone (TENORETIC) 100-25 MG tablet Take 1 tablet by mouth every morning. 09/10/17   Oval Linsey, MD  atorvastatin (LIPITOR) 40 MG tablet Take 1 tablet (40 mg total) by mouth daily at 6 PM. 09/10/17   Oval Linsey, MD  clopidogrel (PLAVIX) 75 MG tablet Take 1 tablet (75 mg total) by mouth daily. 09/10/17   Oval Linsey, MD  latanoprost (XALATAN) 0.005 % ophthalmic solution Place 1 drop into both eyes daily.    [provider]  levobunolol (BETAGAN) 0.5 % ophthalmic solution Place 1 drop into both eyes every morning. 07/22/17   [provider]  Potassium Chloride ER 20 MEQ TBCR Take 1 tablet by mouth daily. 09/10/17   Oval Linsey, MD    Family History Family History  Problem Relation Age of Onset  . Kidney disease Mother        Required hemodialysis, died at the age of 62  . Lung cancer Father        Never smoked  . Hypertension Sister   . Colon cancer Brother   . Lymphoma Sister   . COPD Sister   . Colon cancer Brother   . COPD Brother   . Colon cancer Brother   .  Asthma Brother   . Healthy Daughter   . Healthy Son     Social History Social History   Tobacco Use  . Smoking status: Former Smoker    Years: 5.00    Types: Cigarettes    Last attempt to quit: 06/28/1974    Years since quitting: 43.7  . Smokeless tobacco: Never Used  Substance Use Topics  . Alcohol use: No  . Drug use: Never     Allergies   Patient has no known allergies.   Review of Systems Review of Systems    Constitutional: Negative for chills and fever.  HENT: Negative for ear pain and sore throat.   Eyes: Negative for pain and visual disturbance.  Respiratory: Negative for cough and shortness of breath.   Cardiovascular: Negative for chest pain and palpitations.  Gastrointestinal: Negative for abdominal pain and vomiting.  Genitourinary: Negative for dysuria and hematuria.  Musculoskeletal: Negative for arthralgias and back pain.  Skin: Negative for color change and rash.  Neurological: Positive for numbness. Negative for dizziness, tremors, seizures, syncope, facial asymmetry, speech difficulty, weakness, light-headedness and headaches.  All other systems reviewed and are negative.    Physical Exam Updated Vital Signs  ED Triage Vitals  Enc Vitals Group     BP 03/19/18 0943 (!) 155/73     Pulse Rate 03/19/18 0943 70     Resp 03/19/18 0943 16     Temp 03/19/18 0943 97.8 F (36.6 C)     Temp Source 03/19/18 0943 Oral     SpO2 03/19/18 0943 99 %     Weight 03/19/18 0942 158 lb (71.7 kg)     Height 03/19/18 0942 5\' 4"  (1.626 m)     Head Circumference --      Peak Flow --      Pain Score 03/19/18 0942 0     Pain Loc --      Pain Edu? --      Excl. in [place]? --     Physical Exam Vitals signs and nursing note reviewed.  Constitutional:      General: She is not in acute distress.    Appearance: She is well-developed. She is not toxic-appearing.  HENT:     Head: Normocephalic and atraumatic.     Nose: Nose normal.     Mouth/Throat:     Mouth: Mucous membranes are moist.  Eyes:     Extraocular Movements: Extraocular movements intact.     Conjunctiva/sclera: Conjunctivae normal.     Pupils: Pupils are equal, round, and reactive to light.  Neck:     Musculoskeletal: Normal range of motion and neck supple.  Cardiovascular:     Rate and Rhythm: Normal rate and regular rhythm.     Pulses: Normal pulses.     Heart sounds: No murmur.  Pulmonary:     Effort: Pulmonary effort is  normal. No respiratory distress.     Breath sounds: Normal breath sounds.  Abdominal:     General: Bowel sounds are normal. There is no distension.     Palpations: Abdomen is soft.     Tenderness: There is no abdominal tenderness.  Musculoskeletal: Normal range of motion.        General: No swelling or tenderness.  Skin:    General: Skin is warm and dry.  Neurological:     Mental Status: She is alert and oriented to person, place, and time.     Sensory: Sensory deficit (decreased sensation over radial side of left  hand but otherwise normal sensation ) present.     Comments: 5+/5 strength throughout, normal finger to nose finger, no drift, normal speech  Psychiatric:        Mood and Affect: Mood normal.      ED Treatments / Results  Labs (all labs ordered are listed, but only abnormal results are displayed) Labs Reviewed  CBG MONITORING, ED    EKG None  Radiology No results found.  Procedures Procedures (including critical care time)  Medications Ordered in ED Medications - No data to display   Initial Impression / Assessment and Plan / ED Course  I have reviewed the triage vital signs and the nursing notes.  Pertinent labs & imaging results that were available during my care of the patient were reviewed by me and considered in my medical decision making (see chart for details).     Barbara Holt is a 72 year old female with history of hypertension, high cholesterol, reflux, TIA who presents to the ED with left hand numbness.  Patient with normal vitals.  No fever.  Blood sugar within normal limits.  Patient with intermittent symptoms over the last several weeks.  Has a history of possible carpal tunnel in the past as she used to work as a Regulatory affairs officer.  Patient continues to crochet.  She is mostly right-handed.  Has had some increased in activity with her hands.  Neurologically she appears intact.  She does have some decreased sensation along the median nerve  distribution in the left hand but otherwise normal sensation, normal strength throughout.  No concern for stroke.  Likely peripheral neuropathy/carpal tunnel syndrome.  Recommend Tylenol, Motrin.  Recommend follow-up with primary care doctor.  She states that both of her hands and feet feel cold at times.  Less likely raynaud syndrome.  Given information to follow-up with neurology as well but believe primary care doctor can dictate further care. Given return precautions.  This chart was dictated using voice recognition software.  Despite best efforts to proofread,  errors can occur which can change the documentation meaning.   Final Clinical Impressions(s) / ED Diagnoses   Final diagnoses:  Numbness and tingling in left hand    ED Discharge Orders    None       Lennice Sites, DO 03/19/18 1000

## 2018-03-19 NOTE — ED Notes (Signed)
ED Provider at bedside. 

## 2018-03-19 NOTE — ED Notes (Signed)
Upon assessment by EDP patient states hand numbness x 1 week.

## 2018-03-19 NOTE — ED Triage Notes (Addendum)
Reports left hand numbness upon waking up this morning at 0630.  Reports hand as cold as well.  Last normal was last night at 1030.  Denies dizziness, blurred vision, leg numbness.

## 2018-03-24 ENCOUNTER — Encounter: Payer: Self-pay | Admitting: Adult Health

## 2018-03-24 ENCOUNTER — Ambulatory Visit (INDEPENDENT_AMBULATORY_CARE_PROVIDER_SITE_OTHER): Payer: Medicare Other | Admitting: Adult Health

## 2018-03-24 VITALS — BP 127/69 | HR 70 | Ht 64.0 in | Wt 151.8 lb

## 2018-03-24 DIAGNOSIS — G459 Transient cerebral ischemic attack, unspecified: Secondary | ICD-10-CM

## 2018-03-24 DIAGNOSIS — G5602 Carpal tunnel syndrome, left upper limb: Secondary | ICD-10-CM | POA: Diagnosis not present

## 2018-03-24 DIAGNOSIS — E7800 Pure hypercholesterolemia, unspecified: Secondary | ICD-10-CM | POA: Diagnosis not present

## 2018-03-24 DIAGNOSIS — I10 Essential (primary) hypertension: Secondary | ICD-10-CM

## 2018-03-24 NOTE — Patient Instructions (Signed)
Continue clopidogrel 75 mg daily  and lipitor 80mg   for secondary stroke prevention  Continue to follow up with PCP regarding cholesterol and blood pressure management   Carpel tunnel left wrist - try use of carpel tunnel brace when you are using your hands more during the day or at night. If you get to the point where you are unable to tolerate pain or worsens, we can consider doing an EMG/nerve conduction study  Continue to stay active and maintain a healthy diet  Continue to monitor blood pressure at home  Maintain strict control of hypertension with blood pressure goal below 130/90, diabetes with hemoglobin A1c goal below 6.5% and cholesterol with LDL cholesterol (bad cholesterol) goal below 70 mg/dL. I also advised the patient to eat a healthy diet with plenty of whole grains, cereals, fruits and vegetables, exercise regularly and maintain ideal body weight.  Followup in the future with me in 6 months or call earlier if needed       Thank you for coming to see [place] at [facility]. I hope we have been able to provide you high quality care today.  You may receive a patient satisfaction survey over the next few weeks. We would appreciate your feedback and comments so that we may continue to improve ourselves and the health of our patients.

## 2018-03-24 NOTE — Progress Notes (Signed)
[name] [address] [place]. [place] [address] [phone]       OFFICE FOLLOW UP NOTE  [name] Date of Birth:  04-18-1945 Medical Record Number:  [id number]   Reason for visit: TIA versus migraine follow up  CHIEF COMPLAINT:  Chief Complaint  Patient presents with  . Follow-up    Nerve damage. Alone. Rm 9. Patient mentioned that she had some numbness/coldness to his letft hand. She thinks she may have carpal tunnel. She stated that she is right handed but she uses her left hand more in heavy lifting compared to her right hand.     HPI: Barbara Holt is being seen today in the office for TIA versus migraine on 07/31/2017. History obtained from patient and chart review. Reviewed all radiology images and labs personally.  [name] is a 72 year old female with history of hypertension, hyperlipidemia who was admitted for left arm weakness after a brief left sided sharp pain in head.  Patient denies any history of migraine.  Denies facial droop or right leg weakness.  MRI/MRA negative.  EF 60 to 60%.  Negative for DVT and unremarkable carotid Doppler.  LDL 86 and A1c 5.9.  Episode concerning for cortical TIA, however, migraine cannot be ruled out.  Patient refused 30-day cardio event monitoring or further embolic work-up, and she is eager to go home.  She has aspirin and pravastatin PTA, recommend aspirin and Plavix for 3 weeks and then Plavix alone.  Continue Lipitor 40.  Patient discharged in stable condition.  09/01/2017 visit: Patient is being seen today for hospital follow-up.  Overall she is doing well and denies recent left arm weakness or sharp pain in the left side of her head.  Denies any other neurological symptoms.  Patient has returned back to all previous activities.  Continues to take Plavix without side effects of bleeding or bruising.  Continues to take Lipitor without side effects of myalgias.  Blood pressure today satisfactory 130/69.   Patient has been continuing to stay active and attempting to workout daily along with eating a healthy diet.  Denies new or worsening stroke/TIA symptoms.  01/03/2018 visit: Patient is being seen today for scheduled follow-up visit.  She states overall she is been doing well without recurrent symptoms of upper extremity weakness or headache.  She states she has been keeping active along with maintaining a healthy diet.  She continues to take Plavix without side effects of bleeding or bruising.  Continues to take Lipitor without side effects myalgias.  Blood pressure today satisfactory at 126/73.  Denies new or worsening stroke/TIA symptoms.  Interval history 03/24/2018: Patient is being seen today by patient request after recent hospital evaluation on 03/19/2018 with complaints of left hand numbness which has been present over the last several weeks.  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  She appeared intact neurologically.  She was found to have decreased sensation along the median nerve distribution in the left hand but otherwise normal sensation and strength throughout.  There was no concern for stroke.  It was felt as though the symptoms were likely peripheral neuropathy/carpal tunnel syndrome and recommended follow-up with PCP and/or neurology.  She states that the symptoms come and go. They are worse with increased activity. She does state that she has been diagnosed with carpel tunnel on the left hand in the past but just rested it and symptoms resolved until  now. Numbness present in thumb and first 2 digits. Symptoms resolved after a few hours. She believes her symptoms worsened due to excessive use of hands over the holidays. She does endorse pain in her hand occassionally and she believes this is due to arthritis.   She continues to do well from a neurological standpoint.  Continues on Plavix without side effects of  bleeding or bruising.  Continues on atorvastatin without side effects myalgias.  Blood pressure today 127/69.    ROS:   14 system review of systems performed and negative with exception of none  PMH:  Past Medical History:  Diagnosis Date  . Diverticulosis of colon 09/10/2017   Descending and sigmoid colon found on colonoscopy 12/13/2014.  [name] Essential hypertension   . Gastroesophageal reflux disease 09/10/2017   Worsened by tomato products.  . Glaucoma 09/10/2017   Followed by ophthalmologist in [facility].  . Grade I diastolic dysfunction 09/24/3149   Per Echo 08/01/2017.  Asymptomatic.  [name] History of transient ischemic attack involving anterior circulation 07/31/2017   Presented with transient left upper extremity weakness.  Standard inpatient evaluation unremarkable.  Considered asprin failure.  [name] Overweight (BMI 25.0-29.9) 09/10/2017  . Perennial allergic rhinitis with seasonal variation 09/10/2017   Mildly symptomatic.  [name] Pure hypercholesterolemia   . Third degree hemorrhoids 09/10/2017    PSH:  Past Surgical History:  Procedure Laterality Date  . TUBAL LIGATION  1980    Social History:  Social History   Socioeconomic History  . Marital status: Divorced    Spouse name: Not on file  . Number of children: 2  . Years of education: Not on file  . Highest education level: Associate degree: academic program  Occupational History  . Occupation: Regulatory affairs officer    Comment: Retired  . Occupation: Print production planner    Comment: Retired  . Occupation: Surveyor, minerals    Comment: Retired  Scientific laboratory technician  . Financial resource strain: Not on file  . Food insecurity:    Worry: Not on file    Inability: Not on file  . Transportation needs:    Medical: Not on file    Non-medical: Not on file  Tobacco Use  . Smoking status: Former Smoker    Years: 5.00    Types: Cigarettes    Last attempt to quit: 06/28/1974    Years since quitting: 43.7  . Smokeless tobacco: Never Used  Substance and Sexual  Activity  . Alcohol use: No  . Drug use: Never  . Sexual activity: Not Currently    Partners: Male  Lifestyle  . Physical activity:    Days per week: 5 days    Minutes per session: 30 min  . Stress: Not at all  Relationships  . Social connections:    Talks on phone: More than three times a week    Gets together: More than three times a week    Attends religious service: More than 4 times per year    Active member of club or organization: Yes    Attends meetings of clubs or organizations: More than 4 times per year    Relationship status: Widowed  . Intimate partner violence:    Fear of current or ex partner: No    Emotionally abused: No    Physically abused: No    Forced sexual activity: No  Other Topics Concern  . Not on file  Social History Narrative   Lives alone in [place].  No pets. Originally from [place], [place].  Husband died of colon cancer. Remarried then divorced.    Right handed    Caffeine: coffee, 1 cup daily, maybe an additional cup here and there    Family History:  Family History  Problem Relation Age of Onset  . Kidney disease Mother        Required hemodialysis, died at the age of 90  . Lung cancer Father        Never smoked  . Hypertension Sister   . Colon cancer Brother   . Lymphoma Sister   . COPD Sister   . Colon cancer Brother   . COPD Brother   . Colon cancer Brother   . Asthma Brother   . Healthy Daughter   . Healthy Son     Medications:   Current Outpatient Medications on File Prior to Visit  Medication Sig Dispense Refill  . atenolol-chlorthalidone (TENORETIC) 100-25 MG tablet Take 1 tablet by mouth every morning. 90 tablet 3  . atorvastatin (LIPITOR) 40 MG tablet Take 1 tablet (40 mg total) by mouth daily at 6 PM. 90 tablet 3  . clopidogrel (PLAVIX) 75 MG tablet Take 1 tablet (75 mg total) by mouth daily. 90 tablet 3  . latanoprost (XALATAN) 0.005 % ophthalmic solution Place 1 drop into both eyes daily.    [name]  levobunolol (BETAGAN) 0.5 % ophthalmic solution Place 1 drop into both eyes every morning.  1  . Potassium Chloride ER 20 MEQ TBCR Take 1 tablet by mouth daily. 90 tablet 3   No current facility-administered medications on file prior to visit.     Allergies:  No Known Allergies   Physical Exam  Vitals:   03/24/18 1251  Weight: 151 lb 12.8 oz (68.9 kg)  Height: 5\' 4"  (1.626 m)   Body mass index is 26.06 kg/m. No exam data present  General: well developed, well nourished, pleasant elderly African-American female, seated, in no evident distress Head: head normocephalic and atraumatic.   Neck: supple with no carotid or supraclavicular bruits Cardiovascular: regular rate and rhythm, no murmurs Musculoskeletal: no deformity; positive Tinel's test on left wrist; positive compression test left wrist Skin:  no rash/petichiae Vascular:  Normal pulses all extremities  Neurologic Exam Mental Status: Awake and fully alert. Oriented to place and time. Recent and remote memory intact. Attention span, concentration and fund of knowledge appropriate. Mood and affect appropriate.  Cranial Nerves: Fundoscopic exam reveals sharp disc margins. Pupils equal, briskly reactive to light. Extraocular movements full without nystagmus. Visual fields full to confrontation. Hearing intact. Facial sensation intact. Face, tongue, palate moves normally and symmetrically.  Motor: Normal bulk and tone. Normal strength in all tested extremity muscles. Sensory.: intact to touch , pinprick , position and vibratory sensation.  Coordination: Rapid alternating movements normal in all extremities. Finger-to-nose and heel-to-shin performed accurately bilaterally. Gait and Station: Arises from chair without difficulty. Stance is normal. Gait demonstrates normal stride length and balance . Able to heel, toe and tandem walk without difficulty.  Reflexes: 1+ and symmetric. Toes downgoing.       Diagnostic Data (Labs,  Imaging, Testing)  CT HEAD WO CONTRAST 07/31/2017 IMPRESSION: 1. No acute intracranial abnormality. 2. Diffuse subcortical white matter changes are mildly advanced for age. These likely reflect the sequela of chronic microvascular ischemia 3. ASPECTS is 10/10  MR BRAIN WO CONTRAST MR MRA HEAD WO CONTRAST 07/31/2017 IMPRESSION: MRI HEAD IMPRESSION 1. No acute intracranial abnormality identified. 2. Mild to moderate chronic small vessel ischemic disease. Otherwise unremarkable brain MRI.  MRA HEAD IMPRESSION 1. Mild distal small vessel atheromatous irregularity. 2. Otherwise normal intracranial MRA. No large vessel occlusion. No proximal high-grade or correctable stenosis identified.  ECHOCARDIOGRAM 08/01/17 Study Conclusions - Left ventricle: The cavity size was normal. Wall thickness was   normal. Systolic function was normal. The estimated ejection   fraction was in the range of 60% to 65%. Wall motion was normal;   there were no regional wall motion abnormalities. Doppler   parameters are consistent with abnormal left ventricular   relaxation (grade 1 diastolic dysfunction). - Atrial septum: No defect or patent foramen ovale was identified.  VAS US CAROTID DUPLEX BILATERAL 08/01/2017 Final Interpretation: Right Carotid: The extracranial vessels were near-normal with only minimal wall        thickening or plaque. Left Carotid: The extracranial vessels were near-normal with only minimal wall       thickening or plaque. Vertebrals: Bilateral vertebral arteries demonstrate antegrade flow. Subclavians: Normal flow hemodynamics were seen in bilateral subclavian       arteries.  VAS [place] LOWER EXTREMITY VENOUS BILAT (DVT) 08/01/2017 Final Interpretation: Right: There is no evidence of deep vein thrombosis in the lower extremity.There is no evidence of superficial venous thrombosis. Left: There is no evidence of deep vein thrombosis in the lower extremity.There  is no evidence of superficial venous thrombosis.      ASSESSMENT: Barbara Holt is a 72 y.o. year old female here with TIA versus migraine on 07/31/2017. Vascular risk factors include HTN and HLD. Patient is being seen today for evaluation of left wrist numbness/tingling. Probable diagnosis left carpel tunnel syndrome.     PLAN: -carpel tunnel syndrome: patient declines interest in surgery and any type of surgical intervention. Advised her to trial use of carpel tunnel brace with increased use of hand or at night. Advised that if she experiences worsening of symptoms, to call us and we can consider doing EMG/nerve conduction study at that time -Continue clopidogrel 75 mg daily  and Lipitor for secondary stroke prevention -F/u with PCP regarding your HLD and HTN management -Advised patient to continue to stay active and eat a healthy diet -Maintain strict control of hypertension with blood pressure goal below 130/90, diabetes with hemoglobin A1c goal below 6.5% and cholesterol with LDL cholesterol (bad cholesterol) goal below 70 mg/dL. I also advised the patient to eat a healthy diet with plenty of whole grains, cereals, fruits and vegetables, exercise regularly and maintain ideal body weight.  Follow up as needed with worsening of carpel tunnel syndrome or TIA symptoms   Greater than 50% of time during this 25 minute visit was spent on counseling,explanation of diagnosis of TIA versus migraine, reviewing risk factor management of HLD and HTN, planning of further management, discussion with patient and family and coordination of care    Venancio Poisson, [facility]  [facility] [address] [place], [place] [address]  Phone [phone] Fax [phone]

## 2018-03-30 ENCOUNTER — Encounter: Payer: Self-pay | Admitting: Internal Medicine

## 2018-03-30 DIAGNOSIS — G5602 Carpal tunnel syndrome, left upper limb: Secondary | ICD-10-CM | POA: Insufficient documentation

## 2018-03-30 HISTORY — DX: Carpal tunnel syndrome, left upper limb: G56.02

## 2018-04-03 NOTE — Progress Notes (Signed)
I agree with the above plan 

## 2018-04-25 DIAGNOSIS — H401131 Primary open-angle glaucoma, bilateral, mild stage: Secondary | ICD-10-CM | POA: Diagnosis not present

## 2018-04-25 DIAGNOSIS — H2513 Age-related nuclear cataract, bilateral: Secondary | ICD-10-CM | POA: Diagnosis not present

## 2018-06-26 ENCOUNTER — Encounter: Payer: Self-pay | Admitting: *Deleted

## 2018-09-12 ENCOUNTER — Other Ambulatory Visit: Payer: Self-pay | Admitting: Internal Medicine

## 2018-09-12 DIAGNOSIS — I10 Essential (primary) hypertension: Secondary | ICD-10-CM

## 2018-09-12 DIAGNOSIS — E7800 Pure hypercholesterolemia, unspecified: Secondary | ICD-10-CM

## 2018-09-12 DIAGNOSIS — Z8673 Personal history of transient ischemic attack (TIA), and cerebral infarction without residual deficits: Secondary | ICD-10-CM

## 2018-09-12 MED ORDER — ATORVASTATIN CALCIUM 40 MG PO TABS: 40.0000 mg | ORAL_TABLET | Freq: Every day | ORAL | 0 refills | Status: DC

## 2018-09-12 MED ORDER — CLOPIDOGREL BISULFATE 75 MG PO TABS: 75.0000 mg | ORAL_TABLET | Freq: Every day | ORAL | 0 refills | Status: DC

## 2018-09-12 MED ORDER — ATENOLOL-CHLORTHALIDONE 100-25 MG PO TABS: 1.0000 | ORAL_TABLET | ORAL | 0 refills | Status: DC

## 2018-09-12 MED ORDER — POTASSIUM CHLORIDE ER 20 MEQ PO TBCR: 1.0000 | EXTENDED_RELEASE_TABLET | Freq: Every day | ORAL | 0 refills | Status: DC

## 2018-09-12 NOTE — Telephone Encounter (Signed)
Need refill on all medicine ;pt contact [phone]  None of her medicine have refill on them   [place], Ladson Arrey

## 2018-09-13 NOTE — Telephone Encounter (Signed)
Yes, ACC is fine. Thank you!

## 2018-09-13 NOTE — Telephone Encounter (Signed)
Dr. Philipp Ovens do not have any open slot. Please advised.

## 2018-09-15 NOTE — Telephone Encounter (Signed)
Per Epic, pt has an appt on 7/2 in [facility].

## 2018-09-29 ENCOUNTER — Other Ambulatory Visit: Payer: Self-pay

## 2018-09-29 ENCOUNTER — Ambulatory Visit (INDEPENDENT_AMBULATORY_CARE_PROVIDER_SITE_OTHER): Payer: Medicare Other | Admitting: Internal Medicine

## 2018-09-29 VITALS — BP 127/83 | HR 75 | Temp 98.6°F | Ht 64.0 in | Wt 158.0 lb

## 2018-09-29 DIAGNOSIS — Z8673 Personal history of transient ischemic attack (TIA), and cerebral infarction without residual deficits: Secondary | ICD-10-CM | POA: Diagnosis not present

## 2018-09-29 DIAGNOSIS — E7800 Pure hypercholesterolemia, unspecified: Secondary | ICD-10-CM | POA: Diagnosis not present

## 2018-09-29 DIAGNOSIS — Z7902 Long term (current) use of antithrombotics/antiplatelets: Secondary | ICD-10-CM | POA: Diagnosis not present

## 2018-09-29 DIAGNOSIS — Z87891 Personal history of nicotine dependence: Secondary | ICD-10-CM

## 2018-09-29 DIAGNOSIS — Z0000 Encounter for general adult medical examination without abnormal findings: Secondary | ICD-10-CM

## 2018-09-29 DIAGNOSIS — Z1159 Encounter for screening for other viral diseases: Secondary | ICD-10-CM

## 2018-09-29 DIAGNOSIS — Z79899 Other long term (current) drug therapy: Secondary | ICD-10-CM | POA: Diagnosis not present

## 2018-09-29 DIAGNOSIS — Z1231 Encounter for screening mammogram for malignant neoplasm of breast: Secondary | ICD-10-CM

## 2018-09-29 DIAGNOSIS — E876 Hypokalemia: Secondary | ICD-10-CM

## 2018-09-29 DIAGNOSIS — I10 Essential (primary) hypertension: Secondary | ICD-10-CM

## 2018-09-29 MED ORDER — ATORVASTATIN CALCIUM 40 MG PO TABS: 40.0000 mg | ORAL_TABLET | Freq: Every day | ORAL | 0 refills | Status: DC

## 2018-09-29 MED ORDER — ATENOLOL-CHLORTHALIDONE 100-25 MG PO TABS: 1.0000 | ORAL_TABLET | ORAL | 0 refills | Status: DC

## 2018-09-29 MED ORDER — POTASSIUM CHLORIDE ER 20 MEQ PO TBCR: 1.0000 | EXTENDED_RELEASE_TABLET | Freq: Every day | ORAL | 0 refills | Status: DC

## 2018-09-29 MED ORDER — CLOPIDOGREL BISULFATE 75 MG PO TABS: 75.0000 mg | ORAL_TABLET | Freq: Every day | ORAL | 0 refills | Status: DC

## 2018-09-29 NOTE — Patient Instructions (Signed)
Thank you for allowing [place] to provide your care today. Today we discussed high blood pressure and transient ischemic attack   I have ordered the following labs for you:  Basic metabolic panel    I will call if any are abnormal.    Today we made the following changes to your medications:   I have refilled your medications.  I have also ordered a screening mammogram for you as this is due at this time.   Please follow-up in six months.    Should you have any questions or concerns please call the internal medicine clinic at [phone].

## 2018-09-29 NOTE — Progress Notes (Signed)
   CC: medication refill, blood pressure check  HPI:  [name] is a 73 y.o. with PMH as below.   Please see A&P for assessment of the patient's acute and chronic medical conditions.    Past Medical History:  Diagnosis Date  . Carpal tunnel syndrome, left 03/30/2018  . Diverticulosis of colon 09/10/2017   Descending and sigmoid colon found on colonoscopy 12/13/2014.  [name] Essential hypertension   . Gastroesophageal reflux disease 09/10/2017   Worsened by tomato products.  . Glaucoma 09/10/2017   Followed by ophthalmologist in [facility].  . Grade I diastolic dysfunction 4/40/3474   Per Echo 08/01/2017.  Asymptomatic.  [name] History of transient ischemic attack involving anterior circulation 07/31/2017   Presented with transient left upper extremity weakness.  Standard inpatient evaluation unremarkable.  Considered asprin failure.  [name] Overweight (BMI 25.0-29.9) 09/10/2017  . Perennial allergic rhinitis with seasonal variation 09/10/2017   Mildly symptomatic.  [name] Pure hypercholesterolemia   . Third degree hemorrhoids 09/10/2017   Review of Systems:   Review of Systems  Constitutional: Negative for malaise/fatigue and weight loss.  HENT: Negative for congestion, sinus pain and sore throat.   Eyes: Negative for double vision and redness.  Respiratory: Negative for cough and shortness of breath.   Cardiovascular: Negative for chest pain, orthopnea and leg swelling.  Gastrointestinal: Negative for constipation, diarrhea, heartburn and nausea.  Genitourinary: Negative for dysuria and frequency.  Skin: Negative for itching and rash.  Neurological: Negative for dizziness, sensory change, seizures, weakness and headaches.  All other systems reviewed and are negative.  Physical Exam:  Constitution: NAD, appears stated age 47: RRR, no m/r/g, no le edema Respiratory: CTA, no w/r/r  Neuro: normal affect, a&ox3 Skin: c/d/i   Vitals:   09/29/18 1031 09/29/18 1036  BP: (!) 143/93 127/83   Pulse: 76 75  Temp: 98.6 F (37 C)   SpO2: 100%   Weight: 158 lb (71.7 kg)   Height: 5\' 4"  (1.626 m)     Assessment & Plan:   See Encounters Tab for problem based charting.  Patient discussed with Dr. Rebeca Alert

## 2018-09-30 LAB — BMP8+ANION GAP
Anion Gap: 16 mmol/L (ref 10.0–18.0)
BUN/Creatinine Ratio: 13 (ref 12–28)
BUN: 10 mg/dL (ref 8–27)
CO2: 25 mmol/L (ref 20–29)
Calcium: 10 mg/dL (ref 8.7–10.3)
Chloride: 97 mmol/L (ref 96–106)
Creatinine, Ser: 0.8 mg/dL (ref 0.57–1.00)
GFR calc Af Amer: 86 mL/min/{1.73_m2} (ref >59)
GFR calc non Af Amer: 75 mL/min/{1.73_m2} (ref >59)
Glucose: 90 mg/dL (ref 65–99)
Potassium: 3.2 mmol/L — ABNORMAL LOW (ref 3.5–5.2)
Sodium: 138 mmol/L (ref 134–144)

## 2018-10-02 NOTE — Assessment & Plan Note (Signed)
Last mammogram was a few years ago and she is due for this. She states she does not need a colonoscopy as last one was around three years ago.    - screening mammogram - HCV screen

## 2018-10-02 NOTE — Assessment & Plan Note (Signed)
She continues to take atorvastatin 40 mg without myalgia or other noted side affects.   - refill atorvastatin 40 mg

## 2018-10-02 NOTE — Assessment & Plan Note (Addendum)
Hx of TIA with LUE weakness which resolved. No new neurological defects on exam today and she has not had any recurrent symptoms of stroke.   - refill plavix 75 mg qd.

## 2018-10-02 NOTE — Assessment & Plan Note (Addendum)
BP Readings from Last 3 Encounters:  09/29/18 127/83  03/24/18 127/69  03/19/18 135/87  Blood pressure is well controlled on atenalol-chlorthalidone 100-25 mg.   - refill bp medications  - refill potassium chloride 33mEq qd - BMP   ADDENDUM: K 3.2. Spoke with patient and she has been taking her potassium. Instructed her to double dose for the next four days and to follow-up in one month for repeat BMP. Plan to increase K if she continues to have hypokalemia.

## 2018-10-03 NOTE — Addendum Note (Signed)
Addended by: Molli Hazard A on: 10/03/2018 10:26 AM   Modules accepted: Orders

## 2018-10-04 LAB — SPECIMEN STATUS REPORT

## 2018-10-04 LAB — HEPATITIS C ANTIBODY: Hep C Virus Ab: <0.1 s/co ratio (ref 0.0–0.9)

## 2018-10-05 NOTE — Progress Notes (Signed)
Internal Medicine Clinic Attending  Case discussed with Dr. Seawell at the time of the visit.  We reviewed the resident's history and exam and pertinent patient test results.  I agree with the assessment, diagnosis, and plan of care documented in the resident's note.  Alexander Raines, M.D., Ph.D.  

## 2018-10-18 DIAGNOSIS — H401131 Primary open-angle glaucoma, bilateral, mild stage: Secondary | ICD-10-CM | POA: Diagnosis not present

## 2018-10-31 ENCOUNTER — Other Ambulatory Visit (INDEPENDENT_AMBULATORY_CARE_PROVIDER_SITE_OTHER): Payer: Medicare Other

## 2018-10-31 ENCOUNTER — Encounter (INDEPENDENT_AMBULATORY_CARE_PROVIDER_SITE_OTHER): Payer: Self-pay

## 2018-10-31 ENCOUNTER — Other Ambulatory Visit: Payer: Self-pay

## 2018-10-31 DIAGNOSIS — E876 Hypokalemia: Secondary | ICD-10-CM | POA: Diagnosis not present

## 2018-11-01 LAB — BMP8+ANION GAP
Anion Gap: 16 mmol/L (ref 10.0–18.0)
BUN/Creatinine Ratio: 12 (ref 12–28)
BUN: 10 mg/dL (ref 8–27)
CO2: 26 mmol/L (ref 20–29)
Calcium: 10.2 mg/dL (ref 8.7–10.3)
Chloride: 94 mmol/L — ABNORMAL LOW (ref 96–106)
Creatinine, Ser: 0.82 mg/dL (ref 0.57–1.00)
GFR calc Af Amer: 84 mL/min/{1.73_m2} (ref >59)
GFR calc non Af Amer: 73 mL/min/{1.73_m2} (ref >59)
Glucose: 71 mg/dL (ref 65–99)
Potassium: 3.4 mmol/L — ABNORMAL LOW (ref 3.5–5.2)
Sodium: 136 mmol/L (ref 134–144)

## 2018-11-15 ENCOUNTER — Other Ambulatory Visit: Payer: Self-pay | Admitting: Internal Medicine

## 2018-11-15 DIAGNOSIS — E876 Hypokalemia: Secondary | ICD-10-CM

## 2018-11-15 NOTE — Progress Notes (Signed)
K continues to be only slightly low with potassium 32meq qd. Will continue at this dose and have her follow-up in three months for labs only. She is agreeable to plan.

## 2018-11-23 ENCOUNTER — Other Ambulatory Visit: Payer: Self-pay

## 2018-11-23 ENCOUNTER — Ambulatory Visit
Admission: RE | Admit: 2018-11-23 | Discharge: 2018-11-23 | Disposition: A | Discharge status: Home / Self Care | Payer: Medicare Other | Source: Ambulatory Visit | Attending: Internal Medicine | Admitting: Internal Medicine

## 2018-11-23 DIAGNOSIS — Z1231 Encounter for screening mammogram for malignant neoplasm of breast: Secondary | ICD-10-CM

## 2019-01-31 ENCOUNTER — Other Ambulatory Visit (INDEPENDENT_AMBULATORY_CARE_PROVIDER_SITE_OTHER): Payer: Medicare Other

## 2019-01-31 ENCOUNTER — Other Ambulatory Visit: Payer: Self-pay

## 2019-01-31 DIAGNOSIS — E876 Hypokalemia: Secondary | ICD-10-CM

## 2019-02-01 LAB — BMP8+ANION GAP
Anion Gap: 18 mmol/L (ref 10.0–18.0)
BUN/Creatinine Ratio: 14 (ref 12–28)
BUN: 11 mg/dL (ref 8–27)
CO2: 24 mmol/L (ref 20–29)
Calcium: 10.1 mg/dL (ref 8.7–10.3)
Chloride: 92 mmol/L — ABNORMAL LOW (ref 96–106)
Creatinine, Ser: 0.81 mg/dL (ref 0.57–1.00)
GFR calc Af Amer: 85 mL/min/{1.73_m2} (ref >59)
GFR calc non Af Amer: 73 mL/min/{1.73_m2} (ref >59)
Glucose: 94 mg/dL (ref 65–99)
Potassium: 3.4 mmol/L — ABNORMAL LOW (ref 3.5–5.2)
Sodium: 134 mmol/L (ref 134–144)

## 2019-02-03 ENCOUNTER — Other Ambulatory Visit: Payer: Self-pay | Admitting: Internal Medicine

## 2019-02-03 DIAGNOSIS — I10 Essential (primary) hypertension: Secondary | ICD-10-CM

## 2019-02-03 MED ORDER — POTASSIUM CHLORIDE ER 20 MEQ PO TBCR: 1.5000 | EXTENDED_RELEASE_TABLET | Freq: Every day | ORAL | 0 refills | Status: DC

## 2019-02-03 NOTE — Progress Notes (Signed)
K consistently low on 20mEq. Will increase to 18mEq. Discussed with patient, and she voiced understanding. She will f/u in clinic in about two weeks.

## 2019-02-13 ENCOUNTER — Encounter: Payer: Self-pay | Admitting: Internal Medicine

## 2019-02-13 ENCOUNTER — Other Ambulatory Visit: Payer: Self-pay

## 2019-02-13 ENCOUNTER — Ambulatory Visit (INDEPENDENT_AMBULATORY_CARE_PROVIDER_SITE_OTHER): Payer: Medicare Other | Admitting: Internal Medicine

## 2019-02-13 VITALS — BP 130/85 | HR 78 | Temp 98.1°F | Ht 64.0 in | Wt 157.7 lb

## 2019-02-13 DIAGNOSIS — T502X5A Adverse effect of carbonic-anhydrase inhibitors, benzothiadiazides and other diuretics, initial encounter: Secondary | ICD-10-CM | POA: Diagnosis not present

## 2019-02-13 DIAGNOSIS — Z79899 Other long term (current) drug therapy: Secondary | ICD-10-CM

## 2019-02-13 DIAGNOSIS — Z87891 Personal history of nicotine dependence: Secondary | ICD-10-CM

## 2019-02-13 DIAGNOSIS — K219 Gastro-esophageal reflux disease without esophagitis: Secondary | ICD-10-CM | POA: Diagnosis not present

## 2019-02-13 DIAGNOSIS — I10 Essential (primary) hypertension: Secondary | ICD-10-CM | POA: Diagnosis not present

## 2019-02-13 DIAGNOSIS — K5790 Diverticulosis of intestine, part unspecified, without perforation or abscess without bleeding: Secondary | ICD-10-CM | POA: Diagnosis not present

## 2019-02-13 DIAGNOSIS — T50905A Adverse effect of unspecified drugs, medicaments and biological substances, initial encounter: Secondary | ICD-10-CM

## 2019-02-13 DIAGNOSIS — E876 Hypokalemia: Secondary | ICD-10-CM

## 2019-02-13 DIAGNOSIS — Z8673 Personal history of transient ischemic attack (TIA), and cerebral infarction without residual deficits: Secondary | ICD-10-CM | POA: Diagnosis not present

## 2019-02-13 NOTE — Patient Instructions (Signed)
Dear BarbaraBarbara Holt,  Thank you for allowing [place] to provide your care today. Today we discussed your potassium level    I have ordered bmp labs for you. I will call if any are abnormal.    Today we made no following changes to your medications:    Please follow-up in 6 months.    Should you have any questions or concerns please call the internal medicine clinic at [phone].    Thank you for choosing [facility].   Hypokalemia Hypokalemia means that the amount of potassium in the blood is lower than normal. Potassium is a chemical (electrolyte) that helps regulate the amount of fluid in the body. It also stimulates muscle tightening (contraction) and helps nerves work properly. Normally, most of the body's potassium is inside cells, and only a very small amount is in the blood. Because the amount in the blood is so small, minor changes to potassium levels in the blood can be life-threatening. What are the causes? This condition may be caused by:  Antibiotic medicine.  Diarrhea or vomiting. Taking too much of a medicine that helps you have a bowel movement (laxative) can cause diarrhea and lead to hypokalemia.  Chronic kidney disease (CKD).  Medicines that help the body get rid of excess fluid (diuretics).  Eating disorders, such as bulimia.  Low magnesium levels in the body.  Sweating a lot. What are the signs or symptoms? Symptoms of this condition include:  Weakness.  Constipation.  Fatigue.  Muscle cramps.  Mental confusion.  Skipped heartbeats or irregular heartbeat (palpitations).  Tingling or numbness. How is this diagnosed? This condition is diagnosed with a blood test. How is this treated? This condition may be treated by:  Taking potassium supplements by mouth.  Adjusting the medicines that you take.  Eating more foods that contain a lot of potassium. If your potassium level is very low, you may need to get potassium through an IV and  be monitored in the hospital. Follow these instructions at home:   Take over-the-counter and prescription medicines only as told by your health care provider. This includes vitamins and supplements.  Eat a healthy diet. A healthy diet includes fresh fruits and vegetables, whole grains, healthy fats, and lean proteins.  If instructed, eat more foods that contain a lot of potassium. This includes: ? Nuts, such as peanuts and pistachios. ? Seeds, such as sunflower seeds and pumpkin seeds. ? Peas, lentils, and lima beans. ? Whole grain and bran cereals and breads. ? Fresh fruits and vegetables, such as apricots, avocado, bananas, cantaloupe, kiwi, oranges, tomatoes, asparagus, and potatoes. ? Orange juice. ? Tomato juice. ? Red meats. ? Yogurt.  Keep all follow-up visits as told by your health care provider. This is important. Contact a health care provider if you:  Have weakness that gets worse.  Feel your heart pounding or racing.  Vomit.  Have diarrhea.  Have diabetes (diabetes mellitus) and you have trouble keeping your blood sugar (glucose) in your target range. Get help right away if you:  Have chest pain.  Have shortness of breath.  Have vomiting or diarrhea that lasts for more than 2 days.  Faint. Summary  Hypokalemia means that the amount of potassium in the blood is lower than normal.  This condition is diagnosed with a blood test.  Hypokalemia may be treated by taking potassium supplements, adjusting the medicines that you take, or eating more foods that are high in potassium.  If your potassium level is  very low, you may need to get potassium through an IV and be monitored in the hospital. This information is not intended to replace advice given to you by your health care provider. Make sure you discuss any questions you have with your health care provider. Document Released: 03/16/2005 Document Revised: 10/27/2017 Document Reviewed: 10/27/2017 Elsevier  Patient Education  2020 [employer].

## 2019-02-13 NOTE — Progress Notes (Signed)
CC: Low potassium  HPI: [name] is a 73 y.o. F w/ PMh of diverticulosis, GERD, TIA and HTN who presents for f/u visit after being told her potassium is low at a recent visit. She states she was contacted by Dr.Seawell and she has been taking her K at 36mEq as instructed. She denies any significant nausea, vomiting, diarrhea, constipation. Denies any palpitations, numbness, tingling or weakness. Discussed with her diet and identified foods rich in potassium and magnesium. She mentions that she avoids fast food and tries to eat health as much as she can. No other complaints at this time.  Past Medical History:  Diagnosis Date  . Carpal tunnel syndrome, left 03/30/2018  . Diverticulosis of colon 09/10/2017   Descending and sigmoid colon found on colonoscopy 12/13/2014.  [name] Essential hypertension   . Gastroesophageal reflux disease 09/10/2017   Worsened by tomato products.  . Glaucoma 09/10/2017   Followed by ophthalmologist in [facility].  . Grade I diastolic dysfunction [id number]   Per Echo 08/01/2017.  Asymptomatic.  [name] History of transient ischemic attack involving anterior circulation 07/31/2017   Presented with transient left upper extremity weakness.  Standard inpatient evaluation unremarkable.  Considered asprin failure.  [name] Overweight (BMI 25.0-29.9) 09/10/2017  . Perennial allergic rhinitis with seasonal variation 09/10/2017   Mildly symptomatic.  [name] Pure hypercholesterolemia   . Third degree hemorrhoids 09/10/2017   Review of Systems: Review of Systems  Constitutional: Negative for chills, fever and malaise/fatigue.  Eyes: Negative for blurred vision.  Respiratory: Negative for shortness of breath.   Cardiovascular: Negative for chest pain and palpitations.  Gastrointestinal: Negative for constipation, diarrhea, nausea and vomiting.  Neurological: Negative for dizziness, sensory change and focal weakness.  All other systems reviewed and are negative.    Physical Exam:  Vitals:   02/13/19 1042 02/13/19 1103  BP: 134/79 130/85  Pulse: 81 78  Temp: 98.1 F (36.7 C)   TempSrc: Oral   SpO2: 100%   Weight: 157 lb 11.2 oz (71.5 kg)   Height: 5\' 4"  (1.626 m)     Physical Exam  Constitutional: She is oriented to person, place, and time. She appears well-developed and well-nourished. No distress.  HENT:  Mouth/Throat: Oropharynx is clear and moist.  Eyes: Conjunctivae are normal.  Neck: Normal range of motion. Neck supple. No JVD present.  Cardiovascular: Normal rate, regular rhythm, normal heart sounds and intact distal pulses.  No murmur heard. Respiratory: Effort normal and breath sounds normal. She has no wheezes. She has no rales.  GI: Soft. Bowel sounds are normal. She exhibits no distension. There is no abdominal tenderness.  Musculoskeletal: Normal range of motion.        General: No edema.  Neurological: She is alert and oriented to person, place, and time.      Assessment & Plan:   Essential hypertension BP Readings from Last 3 Encounters:  02/13/19 130/85  09/29/18 127/83  03/24/18 127/69   BP well controlled on atenolol-chlorthalidone 100-25mg . Slightly elevated today but first time meeting different provider. Denies any light-headedness or dizziness. Discussed possible etiology of hypokalemia due to chlorthalidone and described alternative therapies for blood pressure control. Mentions wanting to continue current therapy for now since her bp has been well controlled.  - C/w atenolol-chlorthalidone 100-25mg  - Encouraged to discuss w/ PCP to adjust bp regimen to avoid hypokalemia if recurrent  Drug-induced hypokalemia Presents for f/u management of recurrent hypokalemia. Likely due to chlorthalidone. Discussed importance of dietary choices to provide adequate  potassium. Also discussed possibly role of magnesium in hypokalemia. [name] expressed understanding.  - BMP, Mag - C/w K-dur 40mEq daily for now  Addendum: Hypokalemia  resolved. Mag low normal. Pt contacted and informed of lab results and instructed on dietary choices and encouraged to c/w oral potassium chloride at current dose    Patient discussed with Dr. Lynnae January   -Gilberto Better, [facility] Pager: [phone]

## 2019-02-14 ENCOUNTER — Telehealth: Payer: Self-pay | Admitting: Internal Medicine

## 2019-02-14 DIAGNOSIS — E876 Hypokalemia: Secondary | ICD-10-CM | POA: Insufficient documentation

## 2019-02-14 LAB — BMP8+ANION GAP
Anion Gap: 16 mmol/L (ref 10.0–18.0)
BUN/Creatinine Ratio: 11 — ABNORMAL LOW (ref 12–28)
BUN: 10 mg/dL (ref 8–27)
CO2: 25 mmol/L (ref 20–29)
Calcium: 10.1 mg/dL (ref 8.7–10.3)
Chloride: 96 mmol/L (ref 96–106)
Creatinine, Ser: 0.95 mg/dL (ref 0.57–1.00)
GFR calc Af Amer: 70 mL/min/{1.73_m2} (ref >59)
GFR calc non Af Amer: 60 mL/min/{1.73_m2} (ref >59)
Glucose: 87 mg/dL (ref 65–99)
Potassium: 4 mmol/L (ref 3.5–5.2)
Sodium: 137 mmol/L (ref 134–144)

## 2019-02-14 LAB — MAGNESIUM: Magnesium: 1.6 mg/dL (ref 1.6–2.3)

## 2019-02-14 NOTE — Assessment & Plan Note (Signed)
Presents for f/u management of recurrent hypokalemia. Likely due to chlorthalidone. Discussed importance of dietary choices to provide adequate potassium. Also discussed possibly role of magnesium in hypokalemia. Barbara Holt expressed understanding.  - BMP, Mag - C/w K-dur 63mEq daily for now  Addendum: Hypokalemia resolved. Mag low normal. Pt contacted and informed of lab results and instructed on dietary choices and encouraged to c/w oral potassium chloride at current dose

## 2019-02-14 NOTE — Telephone Encounter (Signed)
Discussed with Barbara Holt regarding her lab results, including resolution of her hypokalemia. Advised to continue potassium supplements as well as choose diet rich in magnesium, such as green leafy vegetables or cereal to avoid hypomagnesemia as it is borderline.

## 2019-02-14 NOTE — Assessment & Plan Note (Signed)
BP Readings from Last 3 Encounters:  02/13/19 130/85  09/29/18 127/83  03/24/18 127/69   BP well controlled on atenolol-chlorthalidone 100-25mg . Slightly elevated today but first time meeting different provider. Denies any light-headedness or dizziness. Discussed possible etiology of hypokalemia due to chlorthalidone and described alternative therapies for blood pressure control. Mentions wanting to continue current therapy for now since her bp has been well controlled.  - C/w atenolol-chlorthalidone 100-25mg  - Encouraged to discuss w/ PCP to adjust bp regimen to avoid hypokalemia if recurrent

## 2019-02-15 NOTE — Progress Notes (Signed)
Internal Medicine Clinic Attending ° °Case discussed with Dr. Lee at the time of the visit.  We reviewed the resident’s history and exam and pertinent patient test results.  I agree with the assessment, diagnosis, and plan of care documented in the resident’s note.  °

## 2019-03-13 ENCOUNTER — Other Ambulatory Visit: Payer: Self-pay | Admitting: Internal Medicine

## 2019-03-13 DIAGNOSIS — Z8673 Personal history of transient ischemic attack (TIA), and cerebral infarction without residual deficits: Secondary | ICD-10-CM

## 2019-03-13 DIAGNOSIS — E7800 Pure hypercholesterolemia, unspecified: Secondary | ICD-10-CM

## 2019-03-13 DIAGNOSIS — I10 Essential (primary) hypertension: Secondary | ICD-10-CM

## 2019-03-13 MED ORDER — POTASSIUM CHLORIDE ER 20 MEQ PO TBCR: 1.5000 | EXTENDED_RELEASE_TABLET | Freq: Every day | ORAL | 1 refills | Status: DC

## 2019-03-13 MED ORDER — ATENOLOL-CHLORTHALIDONE 100-25 MG PO TABS: 1.0000 | ORAL_TABLET | ORAL | 1 refills | Status: DC

## 2019-03-13 MED ORDER — ATORVASTATIN CALCIUM 40 MG PO TABS: 40.0000 mg | ORAL_TABLET | Freq: Every day | ORAL | 1 refills | Status: DC

## 2019-03-13 MED ORDER — CLOPIDOGREL BISULFATE 75 MG PO TABS: 75.0000 mg | ORAL_TABLET | Freq: Every day | ORAL | 1 refills | Status: DC

## 2019-03-13 NOTE — Telephone Encounter (Signed)
Refill Request    atenolol-chlorthalidone (TENORETIC) 100-25 MG tablet    atorvastatin (LIPITOR) 40 MG tablet    clopidogrel (PLAVIX) 75 MG tablet    latanoprost (XALATAN) 0.005 % ophthalmic solution    levobunolol (BETAGAN) 0.5 % ophthalmic solution    Potassium Chloride ER 20 MEQ TBCR    [facility] - [place], [place] - [address]

## 2019-03-13 NOTE — Telephone Encounter (Signed)
Pt called of refills; aware she needs to call her ophthalmologist for refills on her eye drops.

## 2019-03-13 NOTE — Telephone Encounter (Signed)
Approved refill request for Plavix, Lipitor, atenolol-chlorthalidone, and potassium supplements. Declined refills for Betagan and Xalatan eye drops. She needs to request these from her ophthalmologist.

## 2019-03-14 ENCOUNTER — Other Ambulatory Visit: Payer: Self-pay | Admitting: *Deleted

## 2019-03-14 DIAGNOSIS — I10 Essential (primary) hypertension: Secondary | ICD-10-CM

## 2019-03-14 MED ORDER — POTASSIUM CHLORIDE ER 20 MEQ PO TBCR: 1.5000 | EXTENDED_RELEASE_TABLET | Freq: Every day | ORAL | 1 refills | Status: DC

## 2019-03-14 NOTE — Telephone Encounter (Signed)
Potassium was refilled but "No Print". Please re-send electronically. Thanks

## 2019-03-14 NOTE — Telephone Encounter (Signed)
Thank you :)

## 2019-04-10 DIAGNOSIS — H401131 Primary open-angle glaucoma, bilateral, mild stage: Secondary | ICD-10-CM | POA: Diagnosis not present

## 2019-04-10 DIAGNOSIS — H2513 Age-related nuclear cataract, bilateral: Secondary | ICD-10-CM | POA: Diagnosis not present

## 2019-07-27 ENCOUNTER — Other Ambulatory Visit: Payer: Self-pay | Admitting: *Deleted

## 2019-07-27 ENCOUNTER — Other Ambulatory Visit: Payer: Self-pay

## 2019-07-27 ENCOUNTER — Ambulatory Visit (INDEPENDENT_AMBULATORY_CARE_PROVIDER_SITE_OTHER): Payer: Medicare Other | Admitting: Internal Medicine

## 2019-07-27 VITALS — BP 142/61 | HR 78 | Temp 98.5°F | Ht 64.0 in | Wt 157.6 lb

## 2019-07-27 DIAGNOSIS — Z8673 Personal history of transient ischemic attack (TIA), and cerebral infarction without residual deficits: Secondary | ICD-10-CM

## 2019-07-27 DIAGNOSIS — I10 Essential (primary) hypertension: Secondary | ICD-10-CM

## 2019-07-27 DIAGNOSIS — R609 Edema, unspecified: Secondary | ICD-10-CM | POA: Diagnosis not present

## 2019-07-27 DIAGNOSIS — I872 Venous insufficiency (chronic) (peripheral): Secondary | ICD-10-CM | POA: Insufficient documentation

## 2019-07-27 DIAGNOSIS — E7800 Pure hypercholesterolemia, unspecified: Secondary | ICD-10-CM

## 2019-07-27 MED ORDER — CLOPIDOGREL BISULFATE 75 MG PO TABS: 75.0000 mg | ORAL_TABLET | Freq: Every day | ORAL | 1 refills | Status: DC

## 2019-07-27 MED ORDER — POTASSIUM CHLORIDE ER 20 MEQ PO TBCR: 1.5000 | EXTENDED_RELEASE_TABLET | Freq: Every day | ORAL | 1 refills | Status: DC

## 2019-07-27 MED ORDER — ATORVASTATIN CALCIUM 40 MG PO TABS: 40.0000 mg | ORAL_TABLET | Freq: Every day | ORAL | 1 refills | Status: DC

## 2019-07-27 MED ORDER — ATENOLOL-CHLORTHALIDONE 100-25 MG PO TABS: 1.0000 | ORAL_TABLET | ORAL | 1 refills | Status: DC

## 2019-07-27 NOTE — Patient Instructions (Signed)
Ms. Orders, It was great meeting you! Today we discussed your ankle swelling which is a common problem as we age. The best things to do are wearing compression stockings and elevating the legs when you are sitting at home. Continuing to stay active with walking and exercise biking will help as well.   Take care! Dr. Koleen Distance

## 2019-07-27 NOTE — Progress Notes (Signed)
Acute Office Visit  Subjective:    Patient ID: Barbara Holt, female    DOB: [birth year], 74 y.o.   MRN: [id number]  Chief Complaint  Patient presents with  . Joint Swelling    Bilat x > 63months    HPI Patient is in today for bilateral ankle swelling for approximately 6 months. Please see problem based charting for further details.   Past Medical History:  Diagnosis Date  . Carpal tunnel syndrome, left 03/30/2018  . Diverticulosis of colon 09/10/2017   Descending and sigmoid colon found on colonoscopy 12/13/2014.  [name] Essential hypertension   . Gastroesophageal reflux disease 09/10/2017   Worsened by tomato products.  . Glaucoma 09/10/2017   Followed by ophthalmologist in [facility].  . Grade I diastolic dysfunction [id number]   Per Echo 08/01/2017.  Asymptomatic.  [name] History of transient ischemic attack involving anterior circulation 07/31/2017   Presented with transient left upper extremity weakness.  Standard inpatient evaluation unremarkable.  Considered asprin failure.  [name] Overweight (BMI 25.0-29.9) 09/10/2017  . Perennial allergic rhinitis with seasonal variation 09/10/2017   Mildly symptomatic.  [name] Pure hypercholesterolemia   . Third degree hemorrhoids 09/10/2017    Past Surgical History:  Procedure Laterality Date  . TUBAL LIGATION  1980    Family History  Problem Relation Age of Onset  . Kidney disease Mother        Required hemodialysis, died at the age of 66  . Lung cancer Father        Never smoked  . Hypertension Sister   . Colon cancer Brother   . Lymphoma Sister   . COPD Sister   . Colon cancer Brother   . COPD Brother   . Colon cancer Brother   . Asthma Brother   . Healthy Daughter   . Healthy Son     Social History   Socioeconomic History  . Marital status: Divorced    Spouse name: Not on file  . Number of children: 2  . Years of education: Not on file  . Highest education level: Associate degree: academic program  Occupational History  .  Occupation: Regulatory affairs officer    Comment: Retired  . Occupation: Print production planner    Comment: Retired  . Occupation: Surveyor, minerals    Comment: Retired  Tobacco Use  . Smoking status: Former Smoker    Years: 5.00    Types: Cigarettes    Quit date: 06/28/1974    Years since quitting: 45.1  . Smokeless tobacco: Never Used  Substance and Sexual Activity  . Alcohol use: No  . Drug use: Never  . Sexual activity: Not Currently    Partners: Male  Other Topics Concern  . Not on file  Social History Narrative   Lives alone in [place].  No pets. Originally from [place], [place].  Husband died of colon cancer. Remarried then divorced.    Right handed    Caffeine: coffee, 1 cup daily, maybe an additional cup here and there   Social Determinants of Health   Financial Resource Strain:   . Difficulty of Paying Living Expenses:   Food Insecurity:   . Worried About Charity fundraiser in the Last Year:   . Arboriculturist in the Last Year:   Transportation Needs:   . Film/video editor (Medical):   [name] Lack of Transportation (Non-Medical):   Physical Activity:   . Days of Exercise per Week:   . Minutes of Exercise per Session:  Stress:   . Feeling of Stress :   Social Connections:   . Frequency of Communication with Friends and Family:   . Frequency of Social Gatherings with Friends and Family:   . Attends Religious Services:   . Active Member of Clubs or Organizations:   . Attends Archivist Meetings:   [name] Marital Status:   Intimate Partner Violence:   . Fear of Current or Ex-Partner:   . Emotionally Abused:   [name] Physically Abused:   . Sexually Abused:     Outpatient Medications Prior to Visit  Medication Sig Dispense Refill  . latanoprost (XALATAN) 0.005 % ophthalmic solution Place 1 drop into both eyes daily.    [name] levobunolol (BETAGAN) 0.5 % ophthalmic solution Place 1 drop into both eyes every morning.  1  . atenolol-chlorthalidone (TENORETIC) 100-25 MG  tablet Take 1 tablet by mouth every morning. 90 tablet 1  . atorvastatin (LIPITOR) 40 MG tablet Take 1 tablet (40 mg total) by mouth daily at 6 PM. 90 tablet 1  . clopidogrel (PLAVIX) 75 MG tablet Take 1 tablet (75 mg total) by mouth daily. 90 tablet 1  . Potassium Chloride ER 20 MEQ TBCR Take 1.5 tablets by mouth daily. 90 tablet 1   No facility-administered medications prior to visit.    No Known Allergies  Review of Systems  Constitutional: Negative for fatigue and unexpected weight change.  Respiratory: Negative for cough and shortness of breath.   Cardiovascular: Negative for chest pain and palpitations.  Endocrine: Negative for polyuria.  Genitourinary: Negative for difficulty urinating.  Musculoskeletal: Negative for gait problem and joint swelling.  Skin: Negative for rash.  Neurological: Negative for dizziness, weakness, numbness and headaches.       Objective:    Physical Exam Constitutional:      General: She is not in acute distress.    Appearance: Normal appearance.  Eyes:     Conjunctiva/sclera: Conjunctivae normal.  Cardiovascular:     Rate and Rhythm: Normal rate and regular rhythm.  Pulmonary:     Effort: Pulmonary effort is normal.     Breath sounds: Normal breath sounds.  Abdominal:     General: There is no distension.     Palpations: Abdomen is soft.     Tenderness: There is no abdominal tenderness.  Musculoskeletal:     Comments: Non-pitting edema of bilateral lateral malleoli.   Neurological:     Mental Status: She is alert.  Psychiatric:        Mood and Affect: Mood normal.        Behavior: Behavior normal.     BP (!) 142/61 (BP Location: Left Arm, Patient Position: Sitting, Cuff Size: Normal)   Pulse 78   Temp 98.5 F (36.9 C) (Oral)   Ht 5\' 4"  (1.626 m)   Wt 157 lb 9.6 oz (71.5 kg)   SpO2 100%   BMI 27.05 kg/m  Wt Readings from Last 3 Encounters:  07/27/19 157 lb 9.6 oz (71.5 kg)  02/13/19 157 lb 11.2 oz (71.5 kg)  09/29/18 158 lb  (71.7 kg)    Health Maintenance Due  Topic Date Due  . COVID-19 Vaccine (1) Never done  . COLONOSCOPY  Never done  . DEXA SCAN  Never done  . PNA vac Low Risk Adult (1 of 2 - PCV13) Never done    There are no preventive care reminders to display for this patient.   Lab Results  Component Value Date   TSH 1.042 08/01/2017  Lab Results  Component Value Date   WBC 8.2 07/31/2017   HGB 13.0 07/31/2017   HCT 38.6 07/31/2017   MCV 77.0 (L) 07/31/2017   PLT 295 07/31/2017   Lab Results  Component Value Date   NA 137 02/13/2019   K 4.0 02/13/2019   CO2 25 02/13/2019   GLUCOSE 87 02/13/2019   BUN 10 02/13/2019   CREATININE 0.95 02/13/2019   BILITOT 0.4 07/31/2017   ALKPHOS 67 07/31/2017   AST 24 07/31/2017   ALT 23 07/31/2017   PROT 7.0 07/31/2017   ALBUMIN 3.7 07/31/2017   CALCIUM 10.1 02/13/2019   ANIONGAP 9 07/31/2017   Lab Results  Component Value Date   CHOL 155 08/01/2017   Lab Results  Component Value Date   HDL 56 08/01/2017   Lab Results  Component Value Date   LDLCALC 86 08/01/2017   Lab Results  Component Value Date   TRIG 65 08/01/2017   Lab Results  Component Value Date   CHOLHDL 2.8 08/01/2017   Lab Results  Component Value Date   HGBA1C 5.9 (H) 08/01/2017       Assessment & Plan:   Problem List Items Addressed This Visit    None       No orders of the defined types were placed in this encounter.    Delice Bison, DO

## 2019-07-27 NOTE — Assessment & Plan Note (Signed)
Patient presents with bilateral swelling at lateral aspect of ankles for approximately 6 months. She denies any pain, numbness/tingling or skin changes. She often sits without elevating her legs. No dyspnea on exertion, chest pain, or other systemic symptoms.  Presentation most consistent with dependant edema. Advised her to try compression stockings and elevated the legs as much as possible whenever she is sitting for a prolonged period.

## 2019-07-27 NOTE — Telephone Encounter (Signed)
Can you send these to Dr. Koleen Distance? She was seen in [facility] today by Dr. Koleen Distance. Thanks!

## 2019-07-28 NOTE — Progress Notes (Signed)
Internal Medicine Clinic Attending  Case discussed with Dr. Bloomfield at the time of the visit.  We reviewed the resident's history and exam and pertinent patient test results.  I agree with the assessment, diagnosis, and plan of care documented in the resident's note.  

## 2019-08-14 DIAGNOSIS — H401131 Primary open-angle glaucoma, bilateral, mild stage: Secondary | ICD-10-CM | POA: Diagnosis not present

## 2019-08-14 DIAGNOSIS — H2513 Age-related nuclear cataract, bilateral: Secondary | ICD-10-CM | POA: Diagnosis not present

## 2019-10-13 ENCOUNTER — Other Ambulatory Visit: Payer: Self-pay | Admitting: Internal Medicine

## 2019-10-13 DIAGNOSIS — Z1231 Encounter for screening mammogram for malignant neoplasm of breast: Secondary | ICD-10-CM

## 2019-10-27 DIAGNOSIS — Z23 Encounter for immunization: Secondary | ICD-10-CM | POA: Diagnosis not present

## 2019-11-24 DIAGNOSIS — Z23 Encounter for immunization: Secondary | ICD-10-CM | POA: Diagnosis not present

## 2019-11-27 ENCOUNTER — Ambulatory Visit: Payer: Medicare Other

## 2019-11-27 ENCOUNTER — Other Ambulatory Visit: Payer: Self-pay | Admitting: Internal Medicine

## 2019-11-27 DIAGNOSIS — I10 Essential (primary) hypertension: Secondary | ICD-10-CM

## 2019-11-27 MED ORDER — POTASSIUM CHLORIDE ER 20 MEQ PO TBCR: 1.5000 | EXTENDED_RELEASE_TABLET | Freq: Every day | ORAL | 1 refills | Status: DC

## 2019-11-27 NOTE — Telephone Encounter (Signed)
Refill request  atenolol-chlorthalidone (TENORETIC) 100-25 MG tablet atorvastatin (LIPITOR) 40 MG tablet clopidogrel (PLAVIX) 75 MG tablet latanoprost (XALATAN) 0.005 % ophthalmic solution levobunolol (BETAGAN) 0.5 % ophthalmic solution Potassium Chloride ER 20 MEQ TBCR  [facility] - [place], [place] - [address]

## 2019-12-15 DIAGNOSIS — H401131 Primary open-angle glaucoma, bilateral, mild stage: Secondary | ICD-10-CM | POA: Diagnosis not present

## 2019-12-15 DIAGNOSIS — H2513 Age-related nuclear cataract, bilateral: Secondary | ICD-10-CM | POA: Diagnosis not present

## 2020-01-02 ENCOUNTER — Other Ambulatory Visit: Payer: Self-pay

## 2020-01-02 ENCOUNTER — Ambulatory Visit
Admission: RE | Admit: 2020-01-02 | Discharge: 2020-01-02 | Disposition: A | Discharge status: Home / Self Care | Payer: Medicare Other | Source: Ambulatory Visit | Attending: Internal Medicine | Admitting: Internal Medicine

## 2020-01-02 DIAGNOSIS — Z1231 Encounter for screening mammogram for malignant neoplasm of breast: Secondary | ICD-10-CM | POA: Diagnosis not present

## 2020-01-04 ENCOUNTER — Telehealth: Payer: Self-pay | Admitting: Internal Medicine

## 2020-01-04 NOTE — Telephone Encounter (Signed)
Informed Barbara Holt of her mammogram results. All other questions and concerns addressed.

## 2020-03-08 ENCOUNTER — Encounter: Payer: Self-pay | Admitting: *Deleted

## 2020-03-08 NOTE — Progress Notes (Signed)

## 2020-03-14 NOTE — Progress Notes (Signed)
Things That May Be Affecting Your Health:  Alcohol  Hearing loss  Pain    Depression  Home Safety  Sexual Health   Diabetes  Lack of physical activity  Stress   Difficulty with daily activities  Loneliness  Tiredness   Drug use  Medicines  Tobacco use   Falls  Motor Vehicle Safety  Weight   Food choices  Oral Health  Other    YOUR PERSONALIZED HEALTH PLAN : 1. Schedule your next subsequent Medicare Wellness visit in one year 2. Attend all of your regular appointments to address your medical issues 3. Complete the preventative screenings and services   Annual Wellness Visit   Medicare Covered Preventative Screenings and [place] Men and Women Who How Often Need? Date of Last Service Action  Abdominal Aortic Aneurysm Adults with AAA risk factors Once     Alcohol Misuse and Counseling All Adults Screening once a year if no alcohol misuse. Counseling up to 4 face to face sessions.     Bone Density Measurement  Adults at risk for osteoporosis Once every 2 yrs Yes    Please offer  Lipid Panel Z13.6 All adults without CV disease Once every 5 yrs     Colorectal Cancer   Stool sample or  Colonoscopy All adults 17 and older   Once every year  Every 10 years Yes   Please offer  Depression All Adults Once a year  Today   Diabetes Screening Blood glucose, post glucose load, or GTT Z13.1  All adults at risk  Pre-diabetics  Once per year  Twice per year     Diabetes  Self-Management Training All adults Diabetics 10 hrs first year; 2 hours subsequent years. Requires Copay     Glaucoma  Diabetics  Family history of glaucoma  African Americans 19 yrs +  Hispanic Americans 21 yrs + Annually - requires coppay     Hepatitis C Z72.89 or F19.20  High Risk for HCV  Born between 1945 and 1965  Annually  Once     HIV Z11.4 All adults based on risk  Annually btw ages 28 & 76 regardless of risk  Annually > 65 yrs if at increased risk     Lung Cancer  Screening Asymptomatic adults aged 32-77 with 42 pack yr history and current smoker OR quit within the last 15 yrs Annually Must have counseling and shared decision making documentation before first screen     Medical Nutrition Therapy Adults with   Diabetes  Renal disease  Kidney transplant within past 3 yrs 3 hours first year; 2 hours subsequent years     Obesity and Counseling All adults Screening once a year Counseling if BMI 30 or higher  Today   Tobacco Use Counseling Adults who use tobacco  Up to 8 visits in one year     Vaccines Z23  Hepatitis B  Influenza   Pneumonia  Adults   Once  Once every flu season  Two different vaccines separated by one year Yes   Flu shot, PCV13, and please give information on how to schedule COVID-19 vaccine  Next Annual Wellness Visit People with Medicare Every year  Today     Services & Screenings Women Who How Often Need  Date of Last Service Action  Mammogram  Z12.31 Women over 22 One baseline ages 23-39. Annually ager 52 yrs+ no 01/04/20   Pap tests All women Annually if high risk. Every 2 yrs for normal risk women  Screening for cervical cancer with   Pap (Z01.419 nl or Z01.411abnl) &  HPV Z11.51 Women aged 24 to 1 Once every 5 yrs     Screening pelvic and breast exams All women Annually if high risk. Every 2 yrs for normal risk women     Sexually Transmitted Diseases  Chlamydia  Gonorrhea  Syphilis All at risk adults Annually for non pregnant females at increased risk         [place] Men Who How Ofter Need  Date of Last Service Action  Prostate Cancer - DRE & PSA Men over 50 Annually.  DRE might require a copay.     Sexually Transmitted Diseases  Syphilis All at risk adults Annually for men at increased risk

## 2020-03-15 ENCOUNTER — Telehealth: Payer: Self-pay

## 2020-03-15 ENCOUNTER — Other Ambulatory Visit: Payer: Self-pay | Admitting: Internal Medicine

## 2020-03-15 DIAGNOSIS — E7800 Pure hypercholesterolemia, unspecified: Secondary | ICD-10-CM

## 2020-03-15 NOTE — Telephone Encounter (Signed)
Need refill on  latanoprost (XALATAN) 0.005 % ophthalmic solution ;pt contact [place], [place]

## 2020-04-10 ENCOUNTER — Other Ambulatory Visit: Payer: Self-pay

## 2020-04-10 DIAGNOSIS — Z8673 Personal history of transient ischemic attack (TIA), and cerebral infarction without residual deficits: Secondary | ICD-10-CM

## 2020-04-10 MED ORDER — CLOPIDOGREL BISULFATE 75 MG PO TABS: 75.0000 mg | ORAL_TABLET | Freq: Every day | ORAL | 1 refills | Status: DC

## 2020-04-10 NOTE — Telephone Encounter (Signed)
Need refill on clopidogrel (PLAVIX) 75 MG tablet ;pt contact  [place], [place]

## 2020-04-30 ENCOUNTER — Other Ambulatory Visit: Payer: Self-pay

## 2020-04-30 DIAGNOSIS — I10 Essential (primary) hypertension: Secondary | ICD-10-CM

## 2020-04-30 NOTE — Telephone Encounter (Signed)
  atenolol-chlorthalidone (TENORETIC) 100-25 MG tablet, refill request @  [place], [place] Phone:  [phone]  Fax:  [phone]

## 2020-05-06 ENCOUNTER — Other Ambulatory Visit: Payer: Self-pay | Admitting: Internal Medicine

## 2020-05-06 DIAGNOSIS — I10 Essential (primary) hypertension: Secondary | ICD-10-CM

## 2020-05-06 MED ORDER — ATENOLOL-CHLORTHALIDONE 100-25 MG PO TABS: 1.0000 | ORAL_TABLET | ORAL | 0 refills | Status: DC

## 2020-05-06 NOTE — Telephone Encounter (Signed)
Patient needs follow up scheduled with me to establish with new PCP. Approved short term refill of atenolol-chlorthalidone.

## 2020-05-06 NOTE — Telephone Encounter (Signed)
Front office please schedule an appt with Dr Philipp Ovens as new PCP.  Thanks

## 2020-06-03 ENCOUNTER — Other Ambulatory Visit: Payer: Self-pay

## 2020-06-03 DIAGNOSIS — I10 Essential (primary) hypertension: Secondary | ICD-10-CM

## 2020-06-03 MED ORDER — POTASSIUM CHLORIDE ER 20 MEQ PO TBCR: 1.5000 | EXTENDED_RELEASE_TABLET | Freq: Every day | ORAL | 1 refills | Status: DC

## 2020-06-03 NOTE — Telephone Encounter (Signed)
Pt is requesting her Potassium Chloride ER 20 MEQ TBCR sent to  [place], [place] Phone:  [phone]  Fax:  [phone]

## 2020-06-12 ENCOUNTER — Other Ambulatory Visit: Payer: Self-pay

## 2020-06-12 ENCOUNTER — Encounter: Payer: Self-pay | Admitting: Internal Medicine

## 2020-06-12 ENCOUNTER — Ambulatory Visit (INDEPENDENT_AMBULATORY_CARE_PROVIDER_SITE_OTHER): Payer: Medicare Other | Admitting: Internal Medicine

## 2020-06-12 VITALS — BP 139/73 | HR 61 | Temp 98.2°F | Ht 64.0 in | Wt 158.1 lb

## 2020-06-12 DIAGNOSIS — Z8673 Personal history of transient ischemic attack (TIA), and cerebral infarction without residual deficits: Secondary | ICD-10-CM

## 2020-06-12 DIAGNOSIS — Z0000 Encounter for general adult medical examination without abnormal findings: Secondary | ICD-10-CM

## 2020-06-12 DIAGNOSIS — E876 Hypokalemia: Secondary | ICD-10-CM | POA: Diagnosis not present

## 2020-06-12 DIAGNOSIS — E2839 Other primary ovarian failure: Secondary | ICD-10-CM | POA: Diagnosis not present

## 2020-06-12 DIAGNOSIS — T50905A Adverse effect of unspecified drugs, medicaments and biological substances, initial encounter: Secondary | ICD-10-CM | POA: Diagnosis not present

## 2020-06-12 DIAGNOSIS — I872 Venous insufficiency (chronic) (peripheral): Secondary | ICD-10-CM

## 2020-06-12 DIAGNOSIS — R196 Halitosis: Secondary | ICD-10-CM | POA: Diagnosis not present

## 2020-06-12 DIAGNOSIS — E7800 Pure hypercholesterolemia, unspecified: Secondary | ICD-10-CM | POA: Diagnosis not present

## 2020-06-12 DIAGNOSIS — I10 Essential (primary) hypertension: Secondary | ICD-10-CM | POA: Diagnosis not present

## 2020-06-12 MED ORDER — ATENOLOL-CHLORTHALIDONE 100-25 MG PO TABS: 1.0000 | ORAL_TABLET | ORAL | 3 refills | Status: DC

## 2020-06-12 MED ORDER — CLOPIDOGREL BISULFATE 75 MG PO TABS: 75.0000 mg | ORAL_TABLET | Freq: Every day | ORAL | 3 refills | Status: DC

## 2020-06-12 NOTE — Progress Notes (Unsigned)
Subjective:   Patient ID: Barbara Holt female   DOB: 1945-10-16 75 y.o.   MRN: [id number]  HPI: Barbara Holt is a 75 y.o. female with past medical history outlined below here for HTN follow up and to establish with me as new PCP. For the details of today's visit, please refer to the assessment and plan.   Past Medical History:  Diagnosis Date  . Carpal tunnel syndrome, left 03/30/2018  . Diverticulosis of colon 09/10/2017   Descending and sigmoid colon found on colonoscopy 12/13/2014.  [name] Essential hypertension   . Gastroesophageal reflux disease 09/10/2017   Worsened by tomato products.  . Glaucoma 09/10/2017   Followed by ophthalmologist in [facility].  . Grade I diastolic dysfunction 2/68/3419   Per Echo 08/01/2017.  Asymptomatic.  [name] History of transient ischemic attack involving anterior circulation 07/31/2017   Presented with transient left upper extremity weakness.  Standard inpatient evaluation unremarkable.  Considered asprin failure.  [name] Overweight (BMI 25.0-29.9) 09/10/2017  . Perennial allergic rhinitis with seasonal variation 09/10/2017   Mildly symptomatic.  [name] Pure hypercholesterolemia   . Third degree hemorrhoids 09/10/2017   Current Outpatient Medications  Medication Sig Dispense Refill  . atenolol-chlorthalidone (TENORETIC) 100-25 MG tablet Take 1 tablet by mouth every morning. 90 tablet 3  . atorvastatin (LIPITOR) 40 MG tablet TAKE 1 TABLET BY MOUTH ONCE DAILY AT  6  PM 90 tablet 1  . clopidogrel (PLAVIX) 75 MG tablet Take 1 tablet (75 mg total) by mouth daily. 90 tablet 3  . latanoprost (XALATAN) 0.005 % ophthalmic solution Place 1 drop into both eyes daily.    [name] levobunolol (BETAGAN) 0.5 % ophthalmic solution Place 1 drop into both eyes every morning.  1  . Potassium Chloride ER 20 MEQ TBCR Take 1.5 tablets by mouth daily. 135 tablet 1   No current facility-administered medications for this visit.   Family History  Problem Relation Age of Onset  .  Kidney disease Mother        Required hemodialysis, died at the age of 32  . Lung cancer Father        Never smoked  . Hypertension Sister   . Colon cancer Brother   . Lymphoma Sister   . COPD Sister   . Colon cancer Brother   . COPD Brother   . Colon cancer Brother   . Asthma Brother   . Healthy Daughter   . Healthy Son    Social History   Socioeconomic History  . Marital status: Divorced    Spouse name: Not on file  . Number of children: 2  . Years of education: Not on file  . Highest education level: Associate degree: academic program  Occupational History  . Occupation: Regulatory affairs officer    Comment: Retired  . Occupation: Print production planner    Comment: Retired  . Occupation: Surveyor, minerals    Comment: Retired  Tobacco Use  . Smoking status: Former Smoker    Years: 5.00    Types: Cigarettes    Quit date: 06/28/1974    Years since quitting: 45.9  . Smokeless tobacco: Never Used  Vaping Use  . Vaping Use: Never used  Substance and Sexual Activity  . Alcohol use: No  . Drug use: Never  . Sexual activity: Not Currently    Partners: Male  Other Topics Concern  . Not on file  Social History Narrative   Lives alone in [place].  No pets. Originally from [place], [place].  Husband  died of colon cancer. Remarried then divorced.    Right handed    Caffeine: coffee, 1 cup daily, maybe an additional cup here and there   Social Determinants of Health   Financial Resource Strain: Not on file  Food Insecurity: Not on file  Transportation Needs: Not on file  Physical Activity: Not on file  Stress: Not on file  Social Connections: Not on file    Review of Systems: Review of Systems  Respiratory: Negative for shortness of breath.   Cardiovascular: Negative for chest pain.     Objective:  Physical Exam:  Vitals:   06/12/20 1023 06/12/20 1107  BP: (!) 149/78 139/73  Pulse: 72 61  Temp: 98.2 F (36.8 C)   TempSrc: Oral   SpO2: 100%   Weight: 158 lb 1.6 oz  (71.7 kg)   Height: 5\' 4"  (1.626 m)     Physical Exam Constitutional:      Appearance: Normal appearance.  HENT:     Mouth/Throat:     Comments: Dry mucous membranes  Cardiovascular:     Rate and Rhythm: Normal rate and regular rhythm.  Pulmonary:     Effort: Pulmonary effort is normal. No respiratory distress.     Breath sounds: Normal breath sounds.  Musculoskeletal:     Right lower leg: No edema.     Left lower leg: No edema.  Skin:    General: Skin is warm and dry.  Neurological:     Mental Status: She is alert and oriented to person, place, and time.      Assessment & Plan:   See Encounters Tab for problem based charting.

## 2020-06-12 NOTE — Patient Instructions (Addendum)
Barbara Holt,  It was a pleasure to see you today. Please continue taking all of your medications as previously prescribed. I will call you with the results of your blood work.   Follow up with me again in 6 months or sooner if needed.   If you have any questions or concerns, call our clinic at [phone] or after hours call [phone] and ask for the internal medicine resident on call.  Thank you!  Dr. Philipp Ovens

## 2020-06-13 ENCOUNTER — Encounter: Payer: Self-pay | Admitting: Internal Medicine

## 2020-06-13 NOTE — Assessment & Plan Note (Signed)
Patient declining all vaccination despite counseling (flu, PPSV 23, and COVID-19). She is agreeable to getting a DEXA scan, order placed.

## 2020-06-13 NOTE — Assessment & Plan Note (Signed)
Chronic hypokalemia 2/2 to HCTZ use. Takes potassium 30 mEq daily. Checking BMP and magnesium.

## 2020-06-13 NOTE — Assessment & Plan Note (Addendum)
Uncontrolled today with elevated BP 149/78 and 139/73 on recheck. I am just meeting patient for the first time today as new PCP and she is very hesitant to change any of her medications. She is hyperfocused on possible side effects and would rather stick with her current regimen. She has drug induced hypokalemia from her HCTZ and take potassium supplements daily. We discussed this as well, and she still prefers to continue with her current medications. Plan to monitor for now. If elevated at next visit will need to re address. Advised low salt diet.   ADDENDUM: Called back with lab results, potassium and renal function are WNL. She is now interested in changing blood pressure medication in hopes of coming off potassium supplements. She only wants to do this if she can stay on a combination pill to reduce pill burden. I am not sure what combination options we have. She was hesitant to try amlodipine because of her venous insufficiency and LE swelling. I asked her to schedule another appointment if she is interested in changing therapy. We will continue current regimen for now.

## 2020-06-13 NOTE — Assessment & Plan Note (Signed)
Patient reports bilateral LE swelling, dependent in nature. She is wearing compression stockings today and she has minimal swelling limited to her bilateral ankles. Advised supportive measures, continue compression stockings, leg elevation when able.

## 2020-06-13 NOTE — Assessment & Plan Note (Signed)
On plavix 75 mg daily for secondary prevention. Follows with Neurology, last seen in December.

## 2020-06-13 NOTE — Assessment & Plan Note (Addendum)
On atorvastatin 40 mg daily, history of TIA so secondary prevention. Checking lipid panel today.   ADDENDUM: Lipid panel with LDL of 75, at goal.

## 2020-06-14 LAB — BMP8+ANION GAP
Anion Gap: 16 mmol/L (ref 10.0–18.0)
BUN/Creatinine Ratio: 13 (ref 12–28)
BUN: 11 mg/dL (ref 8–27)
CO2: 22 mmol/L (ref 20–29)
Calcium: 10 mg/dL (ref 8.7–10.3)
Chloride: 95 mmol/L — ABNORMAL LOW (ref 96–106)
Creatinine, Ser: 0.83 mg/dL (ref 0.57–1.00)
Glucose: 88 mg/dL (ref 65–99)
Potassium: 3.8 mmol/L (ref 3.5–5.2)
Sodium: 133 mmol/L — ABNORMAL LOW (ref 134–144)
eGFR: 75 mL/min/{1.73_m2} (ref >59)

## 2020-06-14 LAB — LIPID PANEL
Chol/HDL Ratio: 2.7 ratio (ref 0.0–4.4)
Cholesterol, Total: 141 mg/dL (ref 100–199)
HDL: 52 mg/dL (ref >39)
LDL Chol Calc (NIH): 75 mg/dL (ref 0–99)
Triglycerides: 69 mg/dL (ref 0–149)
VLDL Cholesterol Cal: 14 mg/dL (ref 5–40)

## 2020-06-14 LAB — MAGNESIUM: Magnesium: 1.7 mg/dL (ref 1.6–2.3)

## 2020-08-02 DIAGNOSIS — H2513 Age-related nuclear cataract, bilateral: Secondary | ICD-10-CM | POA: Diagnosis not present

## 2020-08-02 DIAGNOSIS — H401131 Primary open-angle glaucoma, bilateral, mild stage: Secondary | ICD-10-CM | POA: Diagnosis not present

## 2020-09-16 ENCOUNTER — Other Ambulatory Visit: Payer: Self-pay | Admitting: Internal Medicine

## 2020-09-16 DIAGNOSIS — E7800 Pure hypercholesterolemia, unspecified: Secondary | ICD-10-CM

## 2020-09-17 NOTE — Telephone Encounter (Signed)
Pt is requesting her atorvastatin (LIPITOR) 40 MG tablet sent to  [place], [place] Phone:  [phone]  Fax:  [phone]

## 2020-10-07 ENCOUNTER — Other Ambulatory Visit: Payer: Self-pay

## 2020-10-07 DIAGNOSIS — Z8673 Personal history of transient ischemic attack (TIA), and cerebral infarction without residual deficits: Secondary | ICD-10-CM

## 2020-10-07 MED ORDER — CLOPIDOGREL BISULFATE 75 MG PO TABS: 75.0000 mg | ORAL_TABLET | Freq: Every day | ORAL | 3 refills | Status: DC

## 2020-10-07 NOTE — Telephone Encounter (Signed)
clopidogrel (PLAVIX) 75 MG tablet, refill request @  [place], [address] Phone:  [phone]  Fax:  [phone]

## 2020-11-15 ENCOUNTER — Ambulatory Visit (INDEPENDENT_AMBULATORY_CARE_PROVIDER_SITE_OTHER): Payer: Medicare Other | Admitting: Student

## 2020-11-15 ENCOUNTER — Telehealth: Payer: Self-pay | Admitting: Internal Medicine

## 2020-11-15 DIAGNOSIS — Z2089 Contact with and (suspected) exposure to other communicable diseases: Secondary | ICD-10-CM | POA: Diagnosis not present

## 2020-11-15 DIAGNOSIS — Z207 Contact with and (suspected) exposure to pediculosis, acariasis and other infestations: Secondary | ICD-10-CM | POA: Insufficient documentation

## 2020-11-15 MED ORDER — PERMETHRIN 5 % EX CREA: 1.0000 "application " | TOPICAL_CREAM | CUTANEOUS | 0 refills | Status: DC

## 2020-11-15 NOTE — Telephone Encounter (Signed)
PT [facility] AN APPT

## 2020-11-15 NOTE — Assessment & Plan Note (Signed)
Patient report recent exposure to scabies from a nursing home where her friend stays.  States that she visited her friend every week for the past several weeks.  This morning she was told by the RN who works there that there is a scabies outbreak.  The whole ward is being treated.  She was told by RN to contact her PCP for treatment. Stated she also had exposure to scabies a few years ago at the nursing home where she worked out.  States that her friend has had intense pruritus and rash for a long time.  She did not know it was scabies.  Patient reports closed skin contact with the friend.  She reports symptom of pruritus in the area between her buttocks and in the labium majora.  Reports some "bumps" between her gluteal folds, unclear chronicity.  Denies symptoms anywhere else including finger webs wrist or elbow.  Patient is living alone.  Assessment and plan The area at the gluteal fold and labia majora are not typical place for scabies infection.  However, given her close skin contact with patient and symptoms of pruritus, will treat with topical permethrin.  Will prescribe 2 treatment courses, 1 week apart.  Patient will let us know if symptoms do not improve and will come in for an in person visit.  -Topical permethrin x 2 courses

## 2020-11-15 NOTE — Progress Notes (Signed)
  [facility] Internal Medicine Residency Telephone Encounter Continuity Care Appointment  HPI:  This telephone encounter was created for [name] on 11/15/2020 for the following purpose/cc exposure to Scabies.   Past Medical History:  Past Medical History:  Diagnosis Date   Carpal tunnel syndrome, left 03/30/2018   Diverticulosis of colon 09/10/2017   Descending and sigmoid colon found on colonoscopy 12/13/2014.   Essential hypertension    Gastroesophageal reflux disease 09/10/2017   Worsened by tomato products.   Glaucoma 09/10/2017   Followed by ophthalmologist in [facility].   Grade I diastolic dysfunction [id number]   Per Echo 08/01/2017.  Asymptomatic.   History of transient ischemic attack involving anterior circulation 07/31/2017   Presented with transient left upper extremity weakness.  Standard inpatient evaluation unremarkable.  Considered asprin failure.   Overweight (BMI 25.0-29.9) 09/10/2017   Perennial allergic rhinitis with seasonal variation 09/10/2017   Mildly symptomatic.   Pure hypercholesterolemia    Third degree hemorrhoids 09/10/2017     ROS:  Positive for pruritis    Assessment / Plan / Recommendations:  Please see A&P under problem oriented charting for assessment of the patient's acute and chronic medical conditions.  As always, pt is advised that if symptoms worsen or new symptoms arise, they should go to an urgent care facility or to to ER for further evaluation.   Consent and Medical Decision Making:  Patient discussed with Dr. Jimmye Norman This is a telephone encounter between Humphrey Rolls Wing[place] and Gaylan Gerold on 11/15/2020 for exposure to Scabies. The visit was conducted with the patient located at home and Gaylan Gerold at [facility]. The patient's identity was confirmed using their DOB and current address. The patient has consented to being evaluated through a telephone encounter and understands the associated risks (an examination cannot be done  and the patient may need to come in for an appointment) / benefits (allows the patient to remain at home, decreasing exposure to coronavirus). I personally spent 19 minutes on medical discussion.

## 2020-11-20 ENCOUNTER — Ambulatory Visit
Admission: RE | Admit: 2020-11-20 | Discharge: 2020-11-20 | Disposition: A | Discharge status: Home / Self Care | Payer: Medicare Other | Source: Ambulatory Visit | Attending: Internal Medicine | Admitting: Internal Medicine

## 2020-11-20 ENCOUNTER — Other Ambulatory Visit: Payer: Self-pay

## 2020-11-20 DIAGNOSIS — E2839 Other primary ovarian failure: Secondary | ICD-10-CM

## 2020-11-20 DIAGNOSIS — Z780 Asymptomatic menopausal state: Secondary | ICD-10-CM | POA: Diagnosis not present

## 2020-11-22 ENCOUNTER — Other Ambulatory Visit: Payer: Self-pay | Admitting: Internal Medicine

## 2020-11-22 DIAGNOSIS — Z1231 Encounter for screening mammogram for malignant neoplasm of breast: Secondary | ICD-10-CM

## 2020-12-18 ENCOUNTER — Other Ambulatory Visit: Payer: Self-pay | Admitting: Internal Medicine

## 2020-12-18 DIAGNOSIS — I10 Essential (primary) hypertension: Secondary | ICD-10-CM

## 2020-12-20 NOTE — Telephone Encounter (Signed)
Approved potassium refill request. Please have patient schedule follow up with me. Thank you!

## 2021-01-02 ENCOUNTER — Ambulatory Visit
Admission: RE | Admit: 2021-01-02 | Discharge: 2021-01-02 | Disposition: A | Discharge status: Home / Self Care | Payer: Medicare Other | Source: Ambulatory Visit | Attending: Internal Medicine | Admitting: Internal Medicine

## 2021-01-02 ENCOUNTER — Other Ambulatory Visit: Payer: Self-pay

## 2021-01-02 DIAGNOSIS — Z1231 Encounter for screening mammogram for malignant neoplasm of breast: Secondary | ICD-10-CM

## 2021-01-10 DIAGNOSIS — H401131 Primary open-angle glaucoma, bilateral, mild stage: Secondary | ICD-10-CM | POA: Diagnosis not present

## 2021-01-10 DIAGNOSIS — H2513 Age-related nuclear cataract, bilateral: Secondary | ICD-10-CM | POA: Diagnosis not present

## 2021-03-05 ENCOUNTER — Other Ambulatory Visit: Payer: Self-pay

## 2021-03-05 ENCOUNTER — Ambulatory Visit (INDEPENDENT_AMBULATORY_CARE_PROVIDER_SITE_OTHER): Payer: Medicare Other | Admitting: Internal Medicine

## 2021-03-05 ENCOUNTER — Ambulatory Visit (HOSPITAL_COMMUNITY)
Admission: RE | Admit: 2021-03-05 | Discharge: 2021-03-05 | Disposition: A | Discharge status: Home / Self Care | Payer: Medicare Other | Source: Ambulatory Visit | Attending: Internal Medicine | Admitting: Internal Medicine

## 2021-03-05 ENCOUNTER — Encounter: Payer: Self-pay | Admitting: Internal Medicine

## 2021-03-05 VITALS — BP 132/75 | HR 72 | Temp 98.3°F | Ht 64.0 in | Wt 150.1 lb

## 2021-03-05 DIAGNOSIS — I10 Essential (primary) hypertension: Secondary | ICD-10-CM

## 2021-03-05 DIAGNOSIS — M898X9 Other specified disorders of bone, unspecified site: Secondary | ICD-10-CM

## 2021-03-05 DIAGNOSIS — M79661 Pain in right lower leg: Secondary | ICD-10-CM | POA: Diagnosis not present

## 2021-03-05 DIAGNOSIS — Z1211 Encounter for screening for malignant neoplasm of colon: Secondary | ICD-10-CM

## 2021-03-05 DIAGNOSIS — Z0000 Encounter for general adult medical examination without abnormal findings: Secondary | ICD-10-CM

## 2021-03-05 NOTE — Patient Instructions (Signed)
Ms. Ghrist,  It was a pleasure to see you. Please continue to take your medications as previously prescribed. I will call you tomorrow with the results of your blood work and xrays. Follow up with me again in 6 months.   If you have any questions or concerns, call our clinic at [phone] or after hours call [phone] and ask for the internal medicine resident on call.   Thank you!  Dr. Darnell Level

## 2021-03-05 NOTE — Progress Notes (Signed)
Subjective:   Patient ID: Barbara Holt female   DOB: 09-01-1945 75 y.o.   MRN: [id number]  HPI: [name] is a 75 y.o. female with past medical history outlined below here for HTN follow up and acute complaint of right leg pain. For the details of today's visit, please refer to the assessment and plan.   Past Medical History:  Diagnosis Date   Carpal tunnel syndrome, left 03/30/2018   Diverticulosis of colon 09/10/2017   Descending and sigmoid colon found on colonoscopy 12/13/2014.   Essential hypertension    Gastroesophageal reflux disease 09/10/2017   Worsened by tomato products.   Glaucoma 09/10/2017   Followed by ophthalmologist in [facility]   Grade I diastolic dysfunction 7/98/9211   Per Echo 08/01/2017.  Asymptomatic.   History of transient ischemic attack involving anterior circulation 07/31/2017   Presented with transient left upper extremity weakness.  Standard inpatient evaluation unremarkable.  Considered asprin failure.   Overweight (BMI 25.0-29.9) 09/10/2017   Perennial allergic rhinitis with seasonal variation 09/10/2017   Mildly symptomatic.   Pure hypercholesterolemia    Third degree hemorrhoids 09/10/2017   Current Outpatient Medications  Medication Sig Dispense Refill   atenolol-chlorthalidone (TENORETIC) 100-25 MG tablet Take 1 tablet by mouth every morning. 90 tablet 3   atorvastatin (LIPITOR) 40 MG tablet TAKE 1 TABLET BY MOUTH ONCE DAILY AT  6  PM 90 tablet 3   clopidogrel (PLAVIX) 75 MG tablet Take 1 tablet (75 mg total) by mouth daily. 90 tablet 3   latanoprost (XALATAN) 0.005 % ophthalmic solution Place 1 drop into both eyes daily.     levobunolol (BETAGAN) 0.5 % ophthalmic solution Place 1 drop into both eyes every morning.  1   permethrin (ELIMITE) 5 % cream Apply 1 application topically once a week. Apply from neck to soles of feet before going to bed. Wash off after 8-14 hours. Repeat treatment 1 week later. 60 g 0   potassium chloride  SA (KLOR-CON) 20 MEQ tablet TAKE 1 & 1/2 (ONE & ONE-HALF) TABLETS BY MOUTH ONCE DAILY 135 tablet 0   No current facility-administered medications for this visit.   Family History  Problem Relation Age of Onset   Kidney disease Mother        Required hemodialysis, died at the age of 3   Lung cancer Father        Never smoked   Hypertension Sister    Colon cancer Brother    Lymphoma Sister    COPD Sister    Colon cancer Brother    COPD Brother    Colon cancer Brother    Asthma Brother    Healthy Daughter    Healthy Son    Social History   Socioeconomic History   Marital status: Divorced    Spouse name: Not on file   Number of children: 2   Years of education: Not on file   Highest education level: Associate degree: academic program  Occupational History   Occupation: Regulatory affairs officer    Comment: Retired   Occupation: Print production planner    Comment: Retired   Occupation: Surveyor, minerals    Comment: Retired  Tobacco Use   Smoking status: Former    Years: 5.00    Types: Cigarettes    Quit date: 06/28/1974    Years since quitting: 46.7   Smokeless tobacco: Never  Vaping Use   Vaping Use: Never used  Substance and Sexual Activity   Alcohol use: No   Drug use:  Never   Sexual activity: Not Currently    Partners: Male  Other Topics Concern   Not on file  Social History Narrative   Lives alone in [place].  No pets. Originally from [place], [place].  Husband died of colon cancer. Remarried then divorced.    Right handed    Caffeine: coffee, 1 cup daily, maybe an additional cup here and there   Social Determinants of Health   Financial Resource Strain: Not on file  Food Insecurity: Not on file  Transportation Needs: Not on file  Physical Activity: Not on file  Stress: Not on file  Social Connections: Not on file    Review of Systems: Review of Systems  Respiratory:  Negative for shortness of breath.   Cardiovascular:  Negative for chest pain.   Musculoskeletal:        Right lower extremity pain     Objective:  Physical Exam:  Vitals:   03/05/21 1029  BP: 132/75  Pulse: 72  Temp: 98.3 F (36.8 C)  TempSrc: Oral  SpO2: 100%  Weight: 150 lb 1.6 oz (68.1 kg)  Height: 5\' 4"  (1.626 m)    Physical Exam Constitutional:      Appearance: Normal appearance.  Cardiovascular:     Rate and Rhythm: Normal rate and regular rhythm.     Heart sounds: No murmur heard. Pulmonary:     Effort: Pulmonary effort is normal.     Breath sounds: Normal breath sounds.  Musculoskeletal:     Comments: Focal pain over her right mid tib/fib. Not reproducible with palpation. No swelling or erythema. Able to weight bear.   Skin:    General: Skin is warm and dry.  Neurological:     Mental Status: She is alert.  Psychiatric:        Mood and Affect: Mood normal.        Behavior: Behavior normal.     Assessment & Plan:   See Encounters Tab for problem based charting.

## 2021-03-06 ENCOUNTER — Encounter: Payer: Self-pay | Admitting: Internal Medicine

## 2021-03-06 DIAGNOSIS — Z1211 Encounter for screening for malignant neoplasm of colon: Secondary | ICD-10-CM | POA: Insufficient documentation

## 2021-03-06 LAB — BMP8+ANION GAP
Anion Gap: 15 mmol/L (ref 10.0–18.0)
BUN/Creatinine Ratio: 17 (ref 12–28)
BUN: 12 mg/dL (ref 8–27)
CO2: 26 mmol/L (ref 20–29)
Calcium: 10.1 mg/dL (ref 8.7–10.3)
Chloride: 90 mmol/L — ABNORMAL LOW (ref 96–106)
Creatinine, Ser: 0.72 mg/dL (ref 0.57–1.00)
Glucose: 98 mg/dL (ref 70–99)
Potassium: 3.8 mmol/L (ref 3.5–5.2)
Sodium: 131 mmol/L — ABNORMAL LOW (ref 134–144)
eGFR: 88 mL/min/{1.73_m2} (ref >59)

## 2021-03-06 NOTE — Assessment & Plan Note (Signed)
Provided patient with immunochemical FOBT kit. Will f/u.

## 2021-03-06 NOTE — Assessment & Plan Note (Signed)
Patient declined all vaccinations today despite counseling. When I called patient later with xray results, she said she changed her mind and will get her flu and zoster vaccine all [facility]. I asked her to bring her vaccine records to her next appointment. She also needs pneumococcal vaccination, will address at her next visit.

## 2021-03-06 NOTE — Assessment & Plan Note (Signed)
Chronic and well controlled on current regimen. She is taking atenolol-chlorthalidone and takes a daily potassium supplement. Offered to change her regimen to atenolol - losartan combination with the goal of coming off potassium, but she prefers to keep her regimen the same for now. BMP checked today with normal potassium of 3.8.

## 2021-03-06 NOTE — Assessment & Plan Note (Signed)
Patient complaining of focal bone pain over right mid fibula. Pain has been intermittent over the past few weeks with waxing and waning intensity. She denies fall, no swelling or bruising over the area. She is very worried about the pain because her sister was diagnosed with some type of bone cancer a few years ago and had to undergo radiation therapy, was told she was not a surgical candidate. Right tib / fib xrays were done today and resulted normal. I called patient with results and reassured her that there was no focal lesions or fracture. Follow up as needed.

## 2021-03-09 DIAGNOSIS — Z23 Encounter for immunization: Secondary | ICD-10-CM | POA: Diagnosis not present

## 2021-03-10 ENCOUNTER — Other Ambulatory Visit: Payer: Medicare Other

## 2021-03-10 DIAGNOSIS — Z1211 Encounter for screening for malignant neoplasm of colon: Secondary | ICD-10-CM | POA: Diagnosis not present

## 2021-03-12 LAB — FECAL OCCULT BLOOD, IMMUNOCHEMICAL: Fecal Occult Bld: NEGATIVE

## 2021-03-18 ENCOUNTER — Other Ambulatory Visit: Payer: Self-pay | Admitting: Internal Medicine

## 2021-03-18 DIAGNOSIS — I10 Essential (primary) hypertension: Secondary | ICD-10-CM

## 2021-06-16 ENCOUNTER — Other Ambulatory Visit: Payer: Self-pay

## 2021-06-16 ENCOUNTER — Other Ambulatory Visit: Payer: Self-pay | Admitting: Internal Medicine

## 2021-06-16 DIAGNOSIS — I10 Essential (primary) hypertension: Secondary | ICD-10-CM

## 2021-06-16 NOTE — Telephone Encounter (Signed)
Please have patient schedule follow up with me in June.  ?

## 2021-06-17 MED ORDER — POTASSIUM CHLORIDE CRYS ER 20 MEQ PO TBCR: 30.0000 meq | EXTENDED_RELEASE_TABLET | Freq: Every day | ORAL | 0 refills | Status: DC

## 2021-06-17 NOTE — Telephone Encounter (Signed)
Approved potassium. Please have patient schedule follow up with me in June, around 6 months from her last appointment with me. Thanks.  ?

## 2021-06-18 ENCOUNTER — Other Ambulatory Visit: Payer: Self-pay

## 2021-06-18 DIAGNOSIS — E7800 Pure hypercholesterolemia, unspecified: Secondary | ICD-10-CM

## 2021-06-18 MED ORDER — ATORVASTATIN CALCIUM 40 MG PO TABS: ORAL_TABLET | ORAL | 3 refills | Status: DC

## 2021-07-04 DIAGNOSIS — H401131 Primary open-angle glaucoma, bilateral, mild stage: Secondary | ICD-10-CM | POA: Diagnosis not present

## 2021-07-04 DIAGNOSIS — H2513 Age-related nuclear cataract, bilateral: Secondary | ICD-10-CM | POA: Diagnosis not present

## 2021-08-27 ENCOUNTER — Ambulatory Visit (INDEPENDENT_AMBULATORY_CARE_PROVIDER_SITE_OTHER): Payer: Medicare Other | Admitting: Internal Medicine

## 2021-08-27 ENCOUNTER — Encounter: Payer: Self-pay | Admitting: Internal Medicine

## 2021-08-27 ENCOUNTER — Other Ambulatory Visit: Payer: Self-pay

## 2021-08-27 VITALS — BP 133/79 | HR 72 | Temp 98.2°F | Ht 64.0 in | Wt 144.4 lb

## 2021-08-27 DIAGNOSIS — L989 Disorder of the skin and subcutaneous tissue, unspecified: Secondary | ICD-10-CM | POA: Diagnosis not present

## 2021-08-27 DIAGNOSIS — I10 Essential (primary) hypertension: Secondary | ICD-10-CM

## 2021-08-27 DIAGNOSIS — T50905A Adverse effect of unspecified drugs, medicaments and biological substances, initial encounter: Secondary | ICD-10-CM | POA: Diagnosis not present

## 2021-08-27 DIAGNOSIS — Z87891 Personal history of nicotine dependence: Secondary | ICD-10-CM | POA: Diagnosis not present

## 2021-08-27 DIAGNOSIS — Z8673 Personal history of transient ischemic attack (TIA), and cerebral infarction without residual deficits: Secondary | ICD-10-CM

## 2021-08-27 DIAGNOSIS — E876 Hypokalemia: Secondary | ICD-10-CM

## 2021-08-27 DIAGNOSIS — L658 Other specified nonscarring hair loss: Secondary | ICD-10-CM | POA: Diagnosis not present

## 2021-08-27 DIAGNOSIS — E7800 Pure hypercholesterolemia, unspecified: Secondary | ICD-10-CM

## 2021-08-27 MED ORDER — ROGAINE 2 % EX SOLN: CUTANEOUS | 2 refills | Status: DC

## 2021-08-27 MED ORDER — CLOPIDOGREL BISULFATE 75 MG PO TABS: 75.0000 mg | ORAL_TABLET | Freq: Every day | ORAL | 3 refills | Status: DC

## 2021-08-27 MED ORDER — ATENOLOL-CHLORTHALIDONE 100-25 MG PO TABS: 1.0000 | ORAL_TABLET | Freq: Every morning | ORAL | 3 refills | Status: DC

## 2021-08-27 MED ORDER — ATORVASTATIN CALCIUM 40 MG PO TABS: ORAL_TABLET | ORAL | 3 refills | Status: DC

## 2021-08-27 NOTE — Patient Instructions (Signed)
Barbara Holt,  It was a pleasure to see you today. I will call you with the results of your blood work. Follow up with me again in 6 months.   If you have any questions or concerns, call our clinic at [phone] or after hours call [phone] and ask for the internal medicine resident on call.   Thank you!  Dr. Darnell Level

## 2021-08-27 NOTE — Assessment & Plan Note (Signed)
Continue plavix, refills sent to pharmacy. Follows with neurology.

## 2021-08-27 NOTE — Progress Notes (Signed)
Subjective:   Patient ID: Barbara Holt female   DOB: 12/20/1945 76 y.o.   MRN: [id number]  HPI: Barbara Holt is a 76 y.o. female with past medical history outlined below here for BP follow up. For the details of today's visit, please refer to the assessment and plan.  Past Medical History:  Diagnosis Date   Carpal tunnel syndrome, left 03/30/2018   Diverticulosis of colon 09/10/2017   Descending and sigmoid colon found on colonoscopy 12/13/2014.   Essential hypertension    Gastroesophageal reflux disease 09/10/2017   Worsened by tomato products.   Glaucoma 09/10/2017   Followed by ophthalmologist in [facility].   Grade I diastolic dysfunction 2/37/6283   Per Echo 08/01/2017.  Asymptomatic.   History of transient ischemic attack involving anterior circulation 07/31/2017   Presented with transient left upper extremity weakness.  Standard inpatient evaluation unremarkable.  Considered asprin failure.   Overweight (BMI 25.0-29.9) 09/10/2017   Perennial allergic rhinitis with seasonal variation 09/10/2017   Mildly symptomatic.   Pure hypercholesterolemia    Third degree hemorrhoids 09/10/2017   Current Outpatient Medications  Medication Sig Dispense Refill   minoxidil (ROGAINE) 2 % external solution Apply 1/2 capful daily 60 mL 2   atenolol-chlorthalidone (TENORETIC) 100-25 MG tablet Take 1 tablet by mouth every morning. 90 tablet 3   atorvastatin (LIPITOR) 40 MG tablet TAKE 1 TABLET BY MOUTH ONCE DAILY AT  6  PM 90 tablet 3   clopidogrel (PLAVIX) 75 MG tablet Take 1 tablet (75 mg total) by mouth daily. 90 tablet 3   latanoprost (XALATAN) 0.005 % ophthalmic solution Place 1 drop into both eyes daily.     levobunolol (BETAGAN) 0.5 % ophthalmic solution Place 1 drop into both eyes every morning.  1   potassium chloride SA (KLOR-CON M) 20 MEQ tablet Take 1.5 tablets (30 mEq total) by mouth daily. 135 tablet 0   timolol (TIMOPTIC) 0.5 % ophthalmic solution 1 drop every morning.      No current facility-administered medications for this visit.   Family History  Problem Relation Age of Onset   Kidney disease Mother        Required hemodialysis, died at the age of 29   Lung cancer Father        Never smoked   Hypertension Sister    Colon cancer Brother    Lymphoma Sister    COPD Sister    Colon cancer Brother    COPD Brother    Colon cancer Brother    Asthma Brother    Healthy Daughter    Healthy Son    Social History   Socioeconomic History   Marital status: Divorced    Spouse name: Not on file   Number of children: 2   Years of education: Not on file   Highest education level: Associate degree: academic program  Occupational History   Occupation: Regulatory affairs officer    Comment: Retired   Occupation: Print production planner    Comment: Retired   Occupation: Surveyor, minerals    Comment: Retired  Tobacco Use   Smoking status: Former    Years: 5.00    Types: Cigarettes    Quit date: 06/28/1974    Years since quitting: 47.1   Smokeless tobacco: Never  Vaping Use   Vaping Use: Never used  Substance and Sexual Activity   Alcohol use: No   Drug use: Never   Sexual activity: Not Currently    Partners: Male  Other Topics Concern   Not  on file  Social History Narrative   Lives alone in [place].  No pets. Originally from [place], [place].  Husband died of colon cancer. Remarried then divorced.    Right handed    Caffeine: coffee, 1 cup daily, maybe an additional cup here and there   Social Determinants of Health   Financial Resource Strain: Not on file  Food Insecurity: Not on file  Transportation Needs: Not on file  Physical Activity: Not on file  Stress: Not on file  Social Connections: Not on file     Objective:  Physical Exam:  Vitals:   08/27/21 1054 08/27/21 1113  BP: (!) 144/77 133/79  Pulse: 72 72  Temp: 98.2 F (36.8 C)   TempSrc: Oral   SpO2: 100%   Weight: 144 lb 6.4 oz (65.5 kg)   Height: '5\' 4"'$  (1.626 m)      Constitutional: NAD, well appearing  Cardiovascular: RRR, no m/r/g Pulmonary/Chest: Clear bilaterally, normal effort Extremities:warm, no edema  Skin: ~1.5 x 1.5 cm dark macule with irregular border over the right occipital scalp     Assessment & Plan:   Scalp lesion Patient recently noted a lesion over her right occipital scalp. It is a dark macule likely nevus measuring 1.5 cm x 1.5 cm. Asymmetric with irregular borders. She does not know how long it has been present. Given the large size, dark appearance and asymmetric, she is agreeable to dermatology referral to biopsy to rule out melenoma.   Pure hypercholesterolemia Doing well on atorvastatin 40 mg daily for secondary prevention. She has a history of TIA. LDL at goal.   History of transient ischemic attack involving anterior circulation Continue plavix, refills sent to pharmacy. Follows with neurology.   Female pattern hair loss Patient with recent worsening hair loss. On exam, appears consistent with post partum female patterned hair loss. Offered further work up with TSH, iron studies. We have deferred this for now. Hair loss is not overly bothersome to her but she is interested in topical therapy with Rogaine. Rx sent to pharmacy.   Essential hypertension Chronic and well controlled. She is taking atenolol-chlorthalidone 100-25 mg daily. She has drug induced hypokalemia as a result and is also taking a daily potassium supplement 30 mEq daily. We have discussed transitioning to a different regimen with the goal of coming off potassium, but she would like to stay on a combination pill and since she has been doing well is hesitant to make changes. Plan to continue with current regimen. I am repeating BMP today and will send potassium refills once this results. Follow up 6 months.

## 2021-08-27 NOTE — Assessment & Plan Note (Signed)
Chronic and well controlled. She is taking atenolol-chlorthalidone 100-25 mg daily. She has drug induced hypokalemia as a result and is also taking a daily potassium supplement 30 mEq daily. We have discussed transitioning to a different regimen with the goal of coming off potassium, but she would like to stay on a combination pill and since she has been doing well is hesitant to make changes. Plan to continue with current regimen. I am repeating BMP today and will send potassium refills once this results. Follow up 6 months.

## 2021-08-27 NOTE — Assessment & Plan Note (Signed)
Doing well on atorvastatin 40 mg daily for secondary prevention. She has a history of TIA. LDL at goal.

## 2021-08-27 NOTE — Assessment & Plan Note (Signed)
Patient with recent worsening hair loss. On exam, appears consistent with post partum female patterned hair loss. Offered further work up with TSH, iron studies. We have deferred this for now. Hair loss is not overly bothersome to her but she is interested in topical therapy with Rogaine. Rx sent to pharmacy.

## 2021-08-27 NOTE — Assessment & Plan Note (Signed)
Patient recently noted a lesion over her right occipital scalp. It is a dark macule likely nevus measuring 1.5 cm x 1.5 cm. Asymmetric with irregular borders. She does not know how long it has been present. Given the large size, dark appearance and asymmetric, she is agreeable to dermatology referral to biopsy to rule out melenoma.

## 2021-08-28 ENCOUNTER — Other Ambulatory Visit: Payer: Self-pay | Admitting: Internal Medicine

## 2021-08-28 DIAGNOSIS — I10 Essential (primary) hypertension: Secondary | ICD-10-CM

## 2021-08-28 LAB — BMP8+ANION GAP
Anion Gap: 14 mmol/L (ref 10.0–18.0)
BUN/Creatinine Ratio: 18 (ref 12–28)
BUN: 15 mg/dL (ref 8–27)
CO2: 24 mmol/L (ref 20–29)
Calcium: 10.1 mg/dL (ref 8.7–10.3)
Chloride: 93 mmol/L — ABNORMAL LOW (ref 96–106)
Creatinine, Ser: 0.82 mg/dL (ref 0.57–1.00)
Glucose: 88 mg/dL (ref 70–99)
Potassium: 3.9 mmol/L (ref 3.5–5.2)
Sodium: 131 mmol/L — ABNORMAL LOW (ref 134–144)
eGFR: 75 mL/min/{1.73_m2} (ref >59)

## 2021-08-28 MED ORDER — POTASSIUM CHLORIDE CRYS ER 20 MEQ PO TBCR: 30.0000 meq | EXTENDED_RELEASE_TABLET | Freq: Every day | ORAL | 3 refills | Status: DC

## 2021-09-10 ENCOUNTER — Other Ambulatory Visit: Payer: Self-pay | Admitting: Internal Medicine

## 2021-09-10 DIAGNOSIS — I10 Essential (primary) hypertension: Secondary | ICD-10-CM

## 2021-11-03 DIAGNOSIS — H2513 Age-related nuclear cataract, bilateral: Secondary | ICD-10-CM | POA: Diagnosis not present

## 2021-11-03 DIAGNOSIS — H401131 Primary open-angle glaucoma, bilateral, mild stage: Secondary | ICD-10-CM | POA: Diagnosis not present

## 2021-11-24 ENCOUNTER — Other Ambulatory Visit: Payer: Self-pay | Admitting: Internal Medicine

## 2021-11-24 DIAGNOSIS — Z1231 Encounter for screening mammogram for malignant neoplasm of breast: Secondary | ICD-10-CM

## 2022-01-05 ENCOUNTER — Ambulatory Visit
Admission: RE | Admit: 2022-01-05 | Discharge: 2022-01-05 | Disposition: A | Discharge status: Home / Self Care | Payer: Medicare Other | Source: Ambulatory Visit | Attending: Internal Medicine | Admitting: Internal Medicine

## 2022-01-05 DIAGNOSIS — Z1231 Encounter for screening mammogram for malignant neoplasm of breast: Secondary | ICD-10-CM | POA: Diagnosis not present

## 2022-01-12 DIAGNOSIS — D485 Neoplasm of uncertain behavior of skin: Secondary | ICD-10-CM | POA: Diagnosis not present

## 2022-02-05 DIAGNOSIS — D485 Neoplasm of uncertain behavior of skin: Secondary | ICD-10-CM | POA: Diagnosis not present

## 2022-02-15 DIAGNOSIS — C4491 Basal cell carcinoma of skin, unspecified: Secondary | ICD-10-CM | POA: Diagnosis not present

## 2022-03-31 DIAGNOSIS — C4441 Basal cell carcinoma of skin of scalp and neck: Secondary | ICD-10-CM | POA: Diagnosis not present

## 2022-04-13 DIAGNOSIS — H2513 Age-related nuclear cataract, bilateral: Secondary | ICD-10-CM | POA: Diagnosis not present

## 2022-04-13 DIAGNOSIS — H401131 Primary open-angle glaucoma, bilateral, mild stage: Secondary | ICD-10-CM | POA: Diagnosis not present

## 2022-06-08 ENCOUNTER — Other Ambulatory Visit: Payer: Self-pay | Admitting: *Deleted

## 2022-06-08 ENCOUNTER — Other Ambulatory Visit: Payer: Self-pay | Admitting: Internal Medicine

## 2022-06-08 DIAGNOSIS — E7800 Pure hypercholesterolemia, unspecified: Secondary | ICD-10-CM

## 2022-06-08 MED ORDER — ATORVASTATIN CALCIUM 40 MG PO TABS: ORAL_TABLET | ORAL | 3 refills | Status: DC

## 2022-06-08 NOTE — Telephone Encounter (Signed)
Refill Request   atorvastatin (LIPITOR) 40 MG tablet  [id number] - [place], [place] - [address]

## 2022-06-15 NOTE — Telephone Encounter (Signed)
Next appt scheduled 3/27 with PCP. 

## 2022-06-16 DIAGNOSIS — L821 Other seborrheic keratosis: Secondary | ICD-10-CM | POA: Diagnosis not present

## 2022-06-16 MED ORDER — ATORVASTATIN CALCIUM 40 MG PO TABS: ORAL_TABLET | ORAL | 3 refills | Status: DC

## 2022-06-24 ENCOUNTER — Ambulatory Visit (INDEPENDENT_AMBULATORY_CARE_PROVIDER_SITE_OTHER): Payer: Medicare Other | Admitting: Internal Medicine

## 2022-06-24 ENCOUNTER — Ambulatory Visit (INDEPENDENT_AMBULATORY_CARE_PROVIDER_SITE_OTHER): Payer: Medicare Other

## 2022-06-24 ENCOUNTER — Other Ambulatory Visit: Payer: Self-pay

## 2022-06-24 ENCOUNTER — Encounter: Payer: Self-pay | Admitting: Internal Medicine

## 2022-06-24 VITALS — BP 135/61 | HR 70 | Temp 98.0°F | Ht 64.0 in | Wt 140.5 lb

## 2022-06-24 DIAGNOSIS — Z8673 Personal history of transient ischemic attack (TIA), and cerebral infarction without residual deficits: Secondary | ICD-10-CM | POA: Diagnosis not present

## 2022-06-24 DIAGNOSIS — Z1211 Encounter for screening for malignant neoplasm of colon: Secondary | ICD-10-CM

## 2022-06-24 DIAGNOSIS — E7800 Pure hypercholesterolemia, unspecified: Secondary | ICD-10-CM | POA: Diagnosis not present

## 2022-06-24 DIAGNOSIS — B351 Tinea unguium: Secondary | ICD-10-CM

## 2022-06-24 DIAGNOSIS — L989 Disorder of the skin and subcutaneous tissue, unspecified: Secondary | ICD-10-CM

## 2022-06-24 DIAGNOSIS — I10 Essential (primary) hypertension: Secondary | ICD-10-CM | POA: Diagnosis not present

## 2022-06-24 DIAGNOSIS — Z0000 Encounter for general adult medical examination without abnormal findings: Secondary | ICD-10-CM

## 2022-06-24 MED ORDER — CICLOPIROX OLAMINE 0.77 % EX SUSP: 1.0000 | Freq: Two times a day (BID) | CUTANEOUS | 1 refills | Status: DC

## 2022-06-24 MED ORDER — CLOPIDOGREL BISULFATE 75 MG PO TABS: 75.0000 mg | ORAL_TABLET | Freq: Every day | ORAL | 3 refills | Status: DC

## 2022-06-24 MED ORDER — POTASSIUM CHLORIDE CRYS ER 20 MEQ PO TBCR: 30.0000 meq | EXTENDED_RELEASE_TABLET | Freq: Every day | ORAL | 3 refills | Status: DC

## 2022-06-24 MED ORDER — ATENOLOL-CHLORTHALIDONE 100-25 MG PO TABS: 1.0000 | ORAL_TABLET | Freq: Every morning | ORAL | 3 refills | Status: DC

## 2022-06-24 MED ORDER — ATORVASTATIN CALCIUM 40 MG PO TABS: ORAL_TABLET | ORAL | 3 refills | Status: DC

## 2022-06-24 NOTE — Patient Instructions (Signed)
[name],  It was a pleasure to see you today. Please continue to take all of your medications as prescribed.   Ok for you to hold your blood thinner (plavix) 1 week prior to your dermatology procedure.   Please follow up with me again in 1 year.   If you have any questions or concerns, call our clinic at [phone] or after hours call [phone] and ask for the internal medicine resident on call.   Thank you!  Dr. Darnell Level

## 2022-06-24 NOTE — Progress Notes (Signed)
[facility],dermatology - called for medical records per Dr Philipp Ovens.

## 2022-06-24 NOTE — Assessment & Plan Note (Signed)
Scheduled for removal with dermatology in the near future.  We are requesting records, she follows with Dr. Daine Floras at [facility].

## 2022-06-24 NOTE — Assessment & Plan Note (Signed)
Refilled Lipitor 40 mg daily.  Recheck lipid panel at next follow-up.

## 2022-06-24 NOTE — Assessment & Plan Note (Signed)
Ordered repeat immunochemical fecal occult blood test for colon cancer screening.  Previous screening has been negative.

## 2022-06-24 NOTE — Progress Notes (Signed)
Subjective:   Barbara Holt is a 77 y.o. female who presents for Medicare Annual (Subsequent) preventive examination. I connected with  Barbara Holt on 06/24/22 by a  Face-To-Face encounter  and verified that I am speaking with the correct person using two identifiers.  Patient Location: Other:  Office/Clinic  Provider Location: Office/Clinic  I discussed the limitations of evaluation and management by telemedicine. The patient expressed understanding and agreed to proceed.  Review of Systems    Defer to PCP       Objective:    Today's Vitals   06/24/22 1337  BP: 135/61  Pulse: 70  Temp: 98 F (36.7 C)  TempSrc: Oral  SpO2: 100%  Weight: 140 lb 8 oz (63.7 kg)  Height: 5\' 4"  (1.626 m)   Body mass index is 24.12 kg/m.     06/24/2022    1:39 PM 06/24/2022   10:30 AM 08/27/2021   10:56 AM 03/05/2021   10:34 AM 06/12/2020   10:26 AM 07/27/2019    1:53 PM 02/13/2019   10:46 AM  Advanced Directives  Does Patient Have a Medical Advance Directive? No No No No No No No  Would patient like information on creating a medical advance directive? No - Patient declined No - Patient declined No - Patient declined No - Patient declined No - Patient declined No - Patient declined No - Patient declined    Current Medications (verified) Outpatient Encounter Medications as of 06/24/2022  Medication Sig   atenolol-chlorthalidone (TENORETIC) 100-25 MG tablet Take 1 tablet by mouth every morning.   atorvastatin (LIPITOR) 40 MG tablet TAKE 1 TABLET BY MOUTH ONCE DAILY AT  6  PM   clopidogrel (PLAVIX) 75 MG tablet Take 1 tablet (75 mg total) by mouth daily.   latanoprost (XALATAN) 0.005 % ophthalmic solution Place 1 drop into both eyes daily.   levobunolol (BETAGAN) 0.5 % ophthalmic solution Place 1 drop into both eyes every morning.   potassium chloride SA (KLOR-CON M) 20 MEQ tablet Take 1.5 tablets (30 mEq total) by mouth daily.   timolol (TIMOPTIC) 0.5 % ophthalmic  solution 1 drop every morning.   [DISCONTINUED] minoxidil (ROGAINE) 2 % external solution Apply 1/2 capful daily   No facility-administered encounter medications on file as of 06/24/2022.    Allergies (verified) Patient has no known allergies.   History: Past Medical History:  Diagnosis Date   Carpal tunnel syndrome, left 03/30/2018   Diverticulosis of colon 09/10/2017   Descending and sigmoid colon found on colonoscopy 12/13/2014.   Essential hypertension    Gastroesophageal reflux disease 09/10/2017   Worsened by tomato products.   Glaucoma 09/10/2017   Followed by ophthalmologist in [facility].   Grade I diastolic dysfunction [id number]   Per Echo 08/01/2017.  Asymptomatic.   History of transient ischemic attack involving anterior circulation 07/31/2017   Presented with transient left upper extremity weakness.  Standard inpatient evaluation unremarkable.  Considered asprin failure.   Overweight (BMI 25.0-29.9) 09/10/2017   Perennial allergic rhinitis with seasonal variation 09/10/2017   Mildly symptomatic.   Pure hypercholesterolemia    Third degree hemorrhoids 09/10/2017   Past Surgical History:  Procedure Laterality Date   TUBAL LIGATION  1980   Family History  Problem Relation Age of Onset   Kidney disease Mother        Required hemodialysis, died at the age of 3   Lung cancer Father        Never smoked   Hypertension Sister  Colon cancer Brother    Lymphoma Sister    COPD Sister    Colon cancer Brother    COPD Brother    Colon cancer Brother    Asthma Brother    Healthy Daughter    Healthy Son    Social History   Socioeconomic History   Marital status: Divorced    Spouse name: Not on file   Number of children: 2   Years of education: Not on file   Highest education level: Associate degree: academic program  Occupational History   Occupation: Regulatory affairs officer    Comment: Retired   Occupation: Print production planner    Comment: Retired   Occupation: Surveyor, minerals     Comment: Retired  Tobacco Use   Smoking status: Former    Years: 5    Types: Cigarettes    Quit date: 06/28/1974    Years since quitting: 48.0   Smokeless tobacco: Never  Vaping Use   Vaping Use: Never used  Substance and Sexual Activity   Alcohol use: No   Drug use: Never   Sexual activity: Not Currently    Partners: Male  Other Topics Concern   Not on file  Social History Narrative   Lives alone in [place].  No pets. Originally from [place], [place].  Husband died of colon cancer. Remarried then divorced.    Right handed    Caffeine: coffee, 1 cup daily, maybe an additional cup here and there   Social Determinants of Health   Financial Resource Strain: Low Risk  (06/24/2022)   Overall Financial Resource Strain (CARDIA)    Difficulty of Paying Living Expenses: Not hard at all  Food Insecurity: No Food Insecurity (06/24/2022)   Hunger Vital Sign    Worried About Running Out of Food in the Last Year: Never true    Ran Out of Food in the Last Year: Never true  Transportation Needs: No Transportation Needs (06/24/2022)   PRAPARE - Hydrologist (Medical): No    Lack of Transportation (Non-Medical): No  Physical Activity: Insufficiently Active (06/24/2022)   Exercise Vital Sign    Days of Exercise per Week: 4 days    Minutes of Exercise per Session: 30 min  Stress: No Stress Concern Present (06/24/2022)   [place]    Feeling of Stress : Not at all  Social Connections: Moderately Integrated (09/10/2017)   Social Connection and Isolation Panel [NHANES]    Frequency of Communication with Friends and Family: More than three times a week    Frequency of Social Gatherings with Friends and Family: More than three times a week    Attends Religious Services: More than 4 times per year    Active Member of [employer] or Organizations: Yes    Attends Archivist Meetings: More than  4 times per year    Marital Status: Widowed    Tobacco Counseling Counseling given: Not Answered   Clinical Intake:  Pre-visit preparation completed: No  Pain : No/denies pain     Nutritional Risks: None Diabetes: No  How often do you need to have someone help you when you read instructions, pamphlets, or other written materials from your doctor or pharmacy?: 1 - Never What is the last grade level you completed in school?: 2 years college  Diabetic?No  Interpreter Needed?: No  Information entered by :: Barbara Holt   Activities of Daily Living    06/24/2022  1:39 PM 06/24/2022   10:30 AM  In your present state of health, do you have any difficulty performing the following activities:  Hearing? 0 0  Vision? 0 0  Difficulty concentrating or making decisions? 0 0  Walking or climbing stairs? 0 0  Dressing or bathing? 0 0  Doing errands, shopping? 0 0    Patient Care Team: Barbara Ochs, MD as PCP - General (Internal Medicine)  Indicate any recent Medical Services you may have received from other than [facility] providers in the past year (date may be approximate).     Assessment:   This is a routine wellness examination for Barbara Holt.  Hearing/Vision screen No results found.  Dietary issues and exercise activities discussed:     Goals Addressed   None   Depression Screen    06/24/2022    1:39 PM 06/24/2022   10:29 AM 08/27/2021   10:56 AM 03/05/2021   10:34 AM 06/12/2020   10:26 AM 07/27/2019    1:59 PM 02/13/2019   10:47 AM  PHQ 2/9 Scores  PHQ - 2 Score 0 0 0 0 0 0 0  PHQ- 9 Score      0     Fall Risk    06/24/2022    1:39 PM 06/24/2022   10:29 AM 08/27/2021   10:56 AM 03/05/2021   10:34 AM 06/12/2020   10:26 AM  Fall Risk   Falls in the past year? 0 0 0 0 0  Number falls in past yr: 0 0 0 0   Injury with Fall? 0 0 0 0   Risk for fall due to : No Fall Risks No Fall Risks No Fall Risks No Fall Risks No Fall Risks  Follow up Falls evaluation  completed;Falls prevention discussed Falls evaluation completed;Education provided Falls evaluation completed;Falls prevention discussed Falls evaluation completed;Falls prevention discussed     FALL RISK PREVENTION PERTAINING TO THE HOME:  Any stairs in or around the home? No  If so, are there any without handrails? No  Home free of loose throw rugs in walkways, pet beds, electrical cords, etc? Yes  Adequate lighting in your home to reduce risk of falls? Yes   ASSISTIVE DEVICES UTILIZED TO PREVENT FALLS:  Life alert? No  Use of a cane, walker or w/c? No  Grab bars in the bathroom? No  Shower chair or bench in shower? No  Elevated toilet seat or a handicapped toilet? No   TIMED UP AND GO:  Was the test performed? No .  Length of time to ambulate 10 feet: 0 sec.   Gait slow and steady without use of assistive device  Cognitive Function:        Immunizations Immunization History  Administered Date(s) Administered   Moderna Sars-Covid-2 Vaccination 10/27/2019, 11/24/2019   PNEUMOCOCCAL CONJUGATE-20 03/09/2021   Tdap 01/30/2017    TDAP status: Up to date  Flu Vaccine status: Due, Education has been provided regarding the importance of this vaccine. Advised may receive this vaccine at local pharmacy or Health Dept. Aware to provide a copy of the vaccination record if obtained from local pharmacy or Health Dept. Verbalized acceptance and understanding.  Pneumococcal vaccine status: Up to date  Covid-19 vaccine status: Completed vaccines  Qualifies for Shingles Vaccine? No   Zostavax completed No   Shingrix Completed?: No.    Education has been provided regarding the importance of this vaccine. Patient has been advised to call insurance company to determine out of pocket expense if they have  not yet received this vaccine. Advised may also receive vaccine at local pharmacy or Health Dept. Verbalized acceptance and understanding.  Screening Tests Health Maintenance  Topic  Date Due   Zoster Vaccines- Shingrix (1 of 2) Never done   INFLUENZA VACCINE  Never done   COVID-19 Vaccine (3 - 2023-24 season) 11/28/2021   COLON CANCER SCREENING ANNUAL FOBT  03/10/2022   Medicare Annual Wellness (AWV)  06/24/2023   MAMMOGRAM  01/06/2024   DTaP/Tdap/Td (2 - Td or Tdap) 01/31/2027   Pneumonia Vaccine 65+ Years old  Completed   DEXA SCAN  Completed   Hepatitis C Screening  Completed   HPV VACCINES  Aged Out   COLONOSCOPY (Pts 45-62yrs Insurance coverage will need to be confirmed)  Discontinued    Health Maintenance  Health Maintenance Due  Topic Date Due   Zoster Vaccines- Shingrix (1 of 2) Never done   INFLUENZA VACCINE  Never done   COVID-19 Vaccine (3 - 2023-24 season) 11/28/2021   COLON CANCER SCREENING ANNUAL FOBT  03/10/2022    Colorectal cancer screening: Referral to GI placed 06/24/2022. Pt aware the office will call re: appt.  Mammogram status: Completed 06/24/2022. Repeat every year    Lung Cancer Screening: (Low Dose CT Chest recommended if Age 99-80 years, 30 pack-year currently smoking OR have quit w/in 15years.) does not qualify.   Lung Cancer Screening Referral: N/A  Additional Screening:  Hepatitis C Screening: does not qualify; Completed 09/29/2018  Vision Screening: Recommended annual ophthalmology exams for early detection of glaucoma and other disorders of the eye. Is the patient up to date with their annual eye exam?  No  Who is the provider or what is the name of the office in which the patient attends annual eye exams? N/A If pt is not established with a provider, would they like to be referred to a provider to establish care? No .   Dental Screening: Recommended annual dental exams for proper oral hygiene  Community Resource Referral / Chronic Care Management: CRR required this visit?  No   CCM required this visit?  No      Plan:     I have personally reviewed and noted the following in the patient's chart:   Medical  and social history Use of alcohol, tobacco or illicit drugs  Current medications and supplements including opioid prescriptions. Patient is not currently taking opioid prescriptions. Functional ability and status Nutritional status Physical activity Advanced directives List of other physicians Hospitalizations, surgeries, and ER visits in previous 12 months Vitals Screenings to include cognitive, depression, and falls Referrals and appointments  In addition, I have reviewed and discussed with patient certain preventive protocols, quality metrics, and best practice recommendations. A written personalized care plan for preventive services as well as general preventive health recommendations were provided to patient.     Barbara Holt, [facility]   06/24/2022   Nurse Notes: Face-To-Face Visit  Ms. Brashears , Thank you for taking time to come for your Medicare Wellness Visit. I appreciate your ongoing commitment to your health goals. Please review the following plan we discussed and let me know if I can assist you in the future.   These are the goals we discussed:  Goals       Blood Pressure < 130/90      Weight (lb) < 145 lb (65.8 kg) (pt-stated)        This is a list of the screening recommended for you and due dates:  Health Maintenance  Topic Date Due   Zoster (Shingles) Vaccine (1 of 2) Never done   Flu Shot  Never done   COVID-19 Vaccine (3 - 2023-24 season) 11/28/2021   Stool Blood Test  03/10/2022   Medicare Annual Wellness Visit  06/24/2023   Mammogram  01/06/2024   DTaP/Tdap/Td vaccine (2 - Td or Tdap) 01/31/2027   Pneumonia Vaccine  Completed   DEXA scan (bone density measurement)  Completed   Hepatitis C Screening: USPSTF Recommendation to screen - Ages 38-79 yo.  Completed   HPV Vaccine  Aged Out   Colon Cancer Screening  Discontinued

## 2022-06-24 NOTE — Assessment & Plan Note (Signed)
Patient is taking Plavix 75 mg daily for secondary prevention in the setting of prior TIA.  She is scheduled for a dermatologic procedure for removal of the scalp lesion.  Okay to hold Plavix 7 days prior to procedure.

## 2022-06-24 NOTE — Assessment & Plan Note (Signed)
Chronic and well-controlled on atenolol-chlorthalidone.  Rechecking renal function today.  She also has drug-induced hypokalemia and takes 30 mEq of Klor-Con daily.  Refill sent to her pharmacy.

## 2022-06-24 NOTE — Progress Notes (Signed)
Subjective:   Patient ID: Barbara Holt female   DOB: [birth year] 77 y.o.   MRN: [id number]  HPI: [name] is a 77 y.o. female with past medical history outlined below here for follow up of her chronic medical conditions. For further details of today's visit, please refer to the assessment and plan below.   Past Medical History:  Diagnosis Date   Carpal tunnel syndrome, left 03/30/2018   Diverticulosis of colon 09/10/2017   Descending and sigmoid colon found on colonoscopy 12/13/2014.   Essential hypertension    Gastroesophageal reflux disease 09/10/2017   Worsened by tomato products.   Glaucoma 09/10/2017   Followed by ophthalmologist in [facility].   Grade I diastolic dysfunction [id number]   Per Echo 08/01/2017.  Asymptomatic.   History of transient ischemic attack involving anterior circulation 07/31/2017   Presented with transient left upper extremity weakness.  Standard inpatient evaluation unremarkable.  Considered asprin failure.   Overweight (BMI 25.0-29.9) 09/10/2017   Perennial allergic rhinitis with seasonal variation 09/10/2017   Mildly symptomatic.   Pure hypercholesterolemia    Third degree hemorrhoids 09/10/2017   Current Outpatient Medications  Medication Sig Dispense Refill   ciclopirox (LOPROX) 0.77 % SUSP Apply 1 Application topically 2 (two) times daily. To the affected toenails. 60 mL 1   atenolol-chlorthalidone (TENORETIC) 100-25 MG tablet Take 1 tablet by mouth every morning. 90 tablet 3   atorvastatin (LIPITOR) 40 MG tablet TAKE 1 TABLET BY MOUTH ONCE DAILY AT  6  PM 90 tablet 3   clopidogrel (PLAVIX) 75 MG tablet Take 1 tablet (75 mg total) by mouth daily. 90 tablet 3   latanoprost (XALATAN) 0.005 % ophthalmic solution Place 1 drop into both eyes daily.     levobunolol (BETAGAN) 0.5 % ophthalmic solution Place 1 drop into both eyes every morning.  1   potassium chloride SA (KLOR-CON M) 20 MEQ tablet Take 1.5 tablets (30 mEq total) by mouth  daily. 135 tablet 3   timolol (TIMOPTIC) 0.5 % ophthalmic solution 1 drop every morning.     No current facility-administered medications for this visit.   Family History  Problem Relation Age of Onset   Kidney disease Mother        Required hemodialysis, died at the age of 37   Lung cancer Father        Never smoked   Hypertension Sister    Colon cancer Brother    Lymphoma Sister    COPD Sister    Colon cancer Brother    COPD Brother    Colon cancer Brother    Asthma Brother    Healthy Daughter    Healthy Son    Social History   Socioeconomic History   Marital status: Divorced    Spouse name: Not on file   Number of children: 2   Years of education: Not on file   Highest education level: Associate degree: academic program  Occupational History   Occupation: Regulatory affairs officer    Comment: Retired   Occupation: Print production planner    Comment: Retired   Occupation: Surveyor, minerals    Comment: Retired  Tobacco Use   Smoking status: Former    Years: 5    Types: Cigarettes    Quit date: 06/28/1974    Years since quitting: 48.0   Smokeless tobacco: Never  Vaping Use   Vaping Use: Never used  Substance and Sexual Activity   Alcohol use: No   Drug use: Never   Sexual activity: Not  Currently    Partners: Male  Other Topics Concern   Not on file  Social History Narrative   Lives alone in [place].  No pets. Originally from [place], [place].  Husband died of colon cancer. Remarried then divorced.    Right handed    Caffeine: coffee, 1 cup daily, maybe an additional cup here and there   Social Determinants of Health   Financial Resource Strain: Not on file  Food Insecurity: Not on file  Transportation Needs: Not on file  Physical Activity: Sufficiently Active (09/10/2017)   Exercise Vital Sign    Days of Exercise per Week: 5 days    Minutes of Exercise per Session: 30 min  Stress: No Stress Concern Present (09/10/2017)   [place]    Feeling of Stress : Not at all  Social Connections: Moderately Integrated (09/10/2017)   Social Connection and Isolation Panel [NHANES]    Frequency of Communication with Friends and Family: More than three times a week    Frequency of Social Gatherings with Friends and Family: More than three times a week    Attends Religious Services: More than 4 times per year    Active Member of [employer] or Organizations: Yes    Attends Archivist Meetings: More than 4 times per year    Marital Status: Widowed     Objective:  Physical Exam:  Vitals:   06/24/22 1027  BP: 135/61  Pulse: 70  Temp: 98 F (36.7 C)  TempSrc: Oral  SpO2: 100%  Weight: 140 lb 8 oz (63.7 kg)  Height: 5\' 4"  (1.626 m)    Constitutional:  HEENT: Dark macule with irregular border over the right occipital scalp Cardiovascular: RRR, no m/r/g Pulmonary/Chest: Clear, normal effort Extremities: Warm, no edema. Thickened dystrophic toenails of bilateral big toes.   Assessment & Plan:   Pure hypercholesterolemia Refilled Lipitor 40 mg daily.  Recheck lipid panel at next follow-up.  Onychomycosis Patient has a thickened dystrophic and discolored nails of her bilateral big toes.  Consistent with onychomycosis.  Discussed trialing topical treatment first with ciclopirox.  If no improvement, consider podiatry referral or oral terbinafine  History of transient ischemic attack involving anterior circulation Patient is taking Plavix 75 mg daily for secondary prevention in the setting of prior TIA.  She is scheduled for a dermatologic procedure for removal of the scalp lesion.  Okay to hold Plavix 7 days prior to procedure.  Essential hypertension Chronic and well-controlled on atenolol-chlorthalidone.  Rechecking renal function today.  She also has drug-induced hypokalemia and takes 30 mEq of Klor-Con daily.  Refill sent to her pharmacy.  Colon cancer screening Ordered repeat  immunochemical fecal occult blood test for colon cancer screening.  Previous screening has been negative.  Scalp lesion Scheduled for removal with dermatology in the near future.  We are requesting records, she follows with Dr. Daine Floras at [facility].

## 2022-06-24 NOTE — Assessment & Plan Note (Signed)
Patient has a thickened dystrophic and discolored nails of her bilateral big toes.  Consistent with onychomycosis.  Discussed trialing topical treatment first with ciclopirox.  If no improvement, consider podiatry referral or oral terbinafine

## 2022-06-24 NOTE — Addendum Note (Signed)
Addended by: Jodean Lima on: 06/24/2022 02:30 PM   Modules accepted: Level of Service

## 2022-06-25 ENCOUNTER — Encounter: Payer: Self-pay | Admitting: Internal Medicine

## 2022-06-25 LAB — BMP8+ANION GAP
Anion Gap: 14 mmol/L (ref 10.0–18.0)
BUN/Creatinine Ratio: 16 (ref 12–28)
BUN: 15 mg/dL (ref 8–27)
CO2: 26 mmol/L (ref 20–29)
Calcium: 10 mg/dL (ref 8.7–10.3)
Chloride: 93 mmol/L — ABNORMAL LOW (ref 96–106)
Creatinine, Ser: 0.92 mg/dL (ref 0.57–1.00)
Glucose: 73 mg/dL (ref 70–99)
Potassium: 3.5 mmol/L (ref 3.5–5.2)
Sodium: 133 mmol/L — ABNORMAL LOW (ref 134–144)
eGFR: 65 mL/min/{1.73_m2} (ref >59)

## 2022-06-25 NOTE — Telephone Encounter (Signed)
Yes, that is the generic for plavix. Ok to hold for surgery. Thank you for reaching out.

## 2022-06-29 DIAGNOSIS — Z1211 Encounter for screening for malignant neoplasm of colon: Secondary | ICD-10-CM | POA: Diagnosis not present

## 2022-06-30 ENCOUNTER — Other Ambulatory Visit: Payer: Medicare Other

## 2022-06-30 DIAGNOSIS — Z1211 Encounter for screening for malignant neoplasm of colon: Secondary | ICD-10-CM

## 2022-07-01 LAB — FECAL OCCULT BLOOD, IMMUNOCHEMICAL: Fecal Occult Bld: NEGATIVE

## 2022-07-21 DIAGNOSIS — C4441 Basal cell carcinoma of skin of scalp and neck: Secondary | ICD-10-CM | POA: Diagnosis not present

## 2022-07-30 DIAGNOSIS — C4491 Basal cell carcinoma of skin, unspecified: Secondary | ICD-10-CM | POA: Diagnosis not present

## 2022-09-03 ENCOUNTER — Other Ambulatory Visit: Payer: Self-pay

## 2022-09-03 DIAGNOSIS — E7800 Pure hypercholesterolemia, unspecified: Secondary | ICD-10-CM

## 2022-09-03 DIAGNOSIS — Z8673 Personal history of transient ischemic attack (TIA), and cerebral infarction without residual deficits: Secondary | ICD-10-CM

## 2022-09-03 DIAGNOSIS — I10 Essential (primary) hypertension: Secondary | ICD-10-CM

## 2022-09-04 MED ORDER — ATENOLOL-CHLORTHALIDONE 100-25 MG PO TABS: 1.0000 | ORAL_TABLET | Freq: Every morning | ORAL | 3 refills | Status: DC

## 2022-09-04 MED ORDER — ATORVASTATIN CALCIUM 40 MG PO TABS: ORAL_TABLET | ORAL | 3 refills | Status: DC

## 2022-09-04 MED ORDER — CLOPIDOGREL BISULFATE 75 MG PO TABS: 75.0000 mg | ORAL_TABLET | Freq: Every day | ORAL | 3 refills | Status: DC

## 2022-09-04 MED ORDER — POTASSIUM CHLORIDE CRYS ER 20 MEQ PO TBCR: 30.0000 meq | EXTENDED_RELEASE_TABLET | Freq: Every day | ORAL | 3 refills | Status: DC

## 2022-09-14 DIAGNOSIS — H2513 Age-related nuclear cataract, bilateral: Secondary | ICD-10-CM | POA: Diagnosis not present

## 2022-09-14 DIAGNOSIS — H401131 Primary open-angle glaucoma, bilateral, mild stage: Secondary | ICD-10-CM | POA: Diagnosis not present

## 2023-01-13 DIAGNOSIS — H401131 Primary open-angle glaucoma, bilateral, mild stage: Secondary | ICD-10-CM | POA: Diagnosis not present

## 2023-01-13 DIAGNOSIS — H2513 Age-related nuclear cataract, bilateral: Secondary | ICD-10-CM | POA: Diagnosis not present

## 2023-02-05 ENCOUNTER — Other Ambulatory Visit: Payer: Self-pay | Admitting: Internal Medicine

## 2023-02-05 DIAGNOSIS — Z1231 Encounter for screening mammogram for malignant neoplasm of breast: Secondary | ICD-10-CM

## 2023-02-08 ENCOUNTER — Ambulatory Visit
Admission: RE | Admit: 2023-02-08 | Discharge: 2023-02-08 | Disposition: A | Discharge status: Home / Self Care | Payer: Medicare Other | Source: Ambulatory Visit | Attending: Internal Medicine | Admitting: Internal Medicine

## 2023-02-08 DIAGNOSIS — Z1231 Encounter for screening mammogram for malignant neoplasm of breast: Secondary | ICD-10-CM

## 2023-02-11 ENCOUNTER — Ambulatory Visit: Payer: Medicare Other | Admitting: Internal Medicine

## 2023-02-11 ENCOUNTER — Encounter: Payer: Self-pay | Admitting: Internal Medicine

## 2023-02-11 VITALS — BP 152/88 | HR 71 | Temp 97.8°F | Ht 64.0 in | Wt 145.4 lb

## 2023-02-11 DIAGNOSIS — L600 Ingrowing nail: Secondary | ICD-10-CM | POA: Insufficient documentation

## 2023-02-11 NOTE — Assessment & Plan Note (Signed)
Patient has developed an ingrown toenail of the right second toe for the past couple months. Walking has become very painful. She feels this is related to the ciclopirox cream that was prescribed for onychomycosis of her great toe. She felt the cream was helpful, but stopped using it when her second toe began to hurt. Patient does not need refill of her ciclopirox.  -Ingrown toenail of second toe trimmed with scissors

## 2023-02-11 NOTE — Progress Notes (Signed)
    Subjective:  CC: Ingrown toenail  HPI:  [name] is a 77 y.o. female with a past medical history stated below and presents today for above. Please see problem based assessment and plan for additional details.  Past Medical History:  Diagnosis Date   Carpal tunnel syndrome, left 03/30/2018   Diverticulosis of colon 09/10/2017   Descending and sigmoid colon found on colonoscopy 12/13/2014.   Essential hypertension    Gastroesophageal reflux disease 09/10/2017   Worsened by tomato products.   Glaucoma 09/10/2017   Followed by ophthalmologist in [facility].   Grade I diastolic dysfunction 09/10/2017   Per Echo 08/01/2017.  Asymptomatic.   History of transient ischemic attack involving anterior circulation 07/31/2017   Presented with transient left upper extremity weakness.  Standard inpatient evaluation unremarkable.  Considered asprin failure.   Overweight (BMI 25.0-29.9) 09/10/2017   Perennial allergic rhinitis with seasonal variation 09/10/2017   Mildly symptomatic.   Pure hypercholesterolemia    Third degree hemorrhoids 09/10/2017    Current Outpatient Medications on File Prior to Visit  Medication Sig Dispense Refill   atenolol-chlorthalidone (TENORETIC) 100-25 MG tablet Take 1 tablet by mouth every morning. 90 tablet 3   atorvastatin (LIPITOR) 40 MG tablet TAKE 1 TABLET BY MOUTH ONCE DAILY AT  6  PM 90 tablet 3   ciclopirox (LOPROX) 0.77 % SUSP Apply 1 Application topically 2 (two) times daily. To the affected toenails. 60 mL 1   clopidogrel (PLAVIX) 75 MG tablet Take 1 tablet (75 mg total) by mouth daily. 90 tablet 3   latanoprost (XALATAN) 0.005 % ophthalmic solution Place 1 drop into both eyes daily.     levobunolol (BETAGAN) 0.5 % ophthalmic solution Place 1 drop into both eyes every morning.  1   potassium chloride SA (KLOR-CON M) 20 MEQ tablet Take 1.5 tablets (30 mEq total) by mouth daily. 135 tablet 3   timolol (TIMOPTIC) 0.5 % ophthalmic solution 1 drop every  morning.     No current facility-administered medications on file prior to visit.    Review of Systems: ROS negative except for as is noted on the assessment and plan.  Objective:   Vitals:   02/11/23 1529  BP: (!) 152/88  Pulse: 71  Temp: 97.8 F (36.6 C)  TempSrc: Oral  SpO2: 100%  Weight: 145 lb 6.4 oz (66 kg)  Height: 5\' 4"  (1.626 m)    Physical Exam: Constitutional: well-appearing, in no acute distress HENT: normocephalic atraumatic, mucous membranes moist Cardiovascular: regular rate and rhythm, no m/r/g Pulmonary/Chest: normal work of breathing on room air, lungs clear to auscultation bilaterally Abdominal: soft, non-tender, non-distended MSK: normal bulk and tone Neurological: alert & oriented x 3, 5/5 strength in bilateral upper and lower extremities Skin: right great toenail darkened with onychomycosis. Right second toe slightly ingrown.   Assessment & Plan:   Ingrown toenail Patient has developed an ingrown toenail of the right second toe for the past couple months. Walking has become very painful. She feels this is related to the ciclopirox cream that was prescribed for onychomycosis of her great toe. She felt the cream was helpful, but stopped using it when her second toe began to hurt. Patient does not need refill of her ciclopirox.  -Ingrown toenail of second toe trimmed with scissors    Patient seen with Dr. Cleda Daub  Monna Fam MD [facility]  PGY-1 Pager: [phone] Date 02/11/2023  Time 4:03 PM

## 2023-02-15 NOTE — Progress Notes (Signed)
Internal Medicine Clinic Attending  I was physically present during the key portions of the resident provided service and participated in the medical decision making of patient's management care. I reviewed pertinent patient test results.  The assessment, diagnosis, and plan were formulated together and I agree with the documentation in the resident's note.  Gust Rung, DO

## 2023-04-14 ENCOUNTER — Telehealth: Payer: Self-pay | Admitting: Internal Medicine

## 2023-04-14 DIAGNOSIS — B351 Tinea unguium: Secondary | ICD-10-CM

## 2023-04-14 DIAGNOSIS — L600 Ingrowing nail: Secondary | ICD-10-CM

## 2023-04-14 NOTE — Telephone Encounter (Signed)
 Referral to podiatry placed

## 2023-04-14 NOTE — Telephone Encounter (Signed)
 Rec'd call from the patient requesting to now to see a Podiatrist .  Pt seen on 02/11/2023 by Dr. Esaw Heckler about her ingrown toenail but wishes to see specialist.  Can a referral be placed for this patient?

## 2023-05-25 ENCOUNTER — Other Ambulatory Visit: Payer: Self-pay | Admitting: Internal Medicine

## 2023-05-25 DIAGNOSIS — I10 Essential (primary) hypertension: Secondary | ICD-10-CM

## 2023-05-25 DIAGNOSIS — E7800 Pure hypercholesterolemia, unspecified: Secondary | ICD-10-CM

## 2023-05-25 DIAGNOSIS — Z8673 Personal history of transient ischemic attack (TIA), and cerebral infarction without residual deficits: Secondary | ICD-10-CM

## 2023-05-25 DIAGNOSIS — B351 Tinea unguium: Secondary | ICD-10-CM

## 2023-05-25 NOTE — Telephone Encounter (Signed)
 Pt  calling to report she has moved as of last week and is looking for a new Dr. Rock Nephew is requesting to send all of her medication to the following pharmacy:   Please call the patient if you have any questions.  [facility]  Located in: [facility] Address: [address] [place], [place] [address]  Phone: [phone]

## 2023-05-26 MED ORDER — CLOPIDOGREL BISULFATE 75 MG PO TABS: 75.0000 mg | ORAL_TABLET | Freq: Every day | ORAL | 3 refills | Status: AC

## 2023-05-26 MED ORDER — POTASSIUM CHLORIDE CRYS ER 20 MEQ PO TBCR: 30.0000 meq | EXTENDED_RELEASE_TABLET | Freq: Every day | ORAL | 3 refills | Status: DC

## 2023-05-26 MED ORDER — ATORVASTATIN CALCIUM 40 MG PO TABS: ORAL_TABLET | ORAL | 3 refills | Status: AC

## 2023-05-26 MED ORDER — CICLOPIROX OLAMINE 0.77 % EX SUSP: 1.0000 | Freq: Two times a day (BID) | CUTANEOUS | 1 refills | Status: AC

## 2023-05-26 MED ORDER — ATENOLOL-CHLORTHALIDONE 100-25 MG PO TABS: 1.0000 | ORAL_TABLET | Freq: Every morning | ORAL | 3 refills | Status: AC

## 2023-07-30 ENCOUNTER — Telehealth: Payer: Self-pay | Admitting: Internal Medicine

## 2023-07-30 NOTE — Telephone Encounter (Signed)
 Copied from CRM [id number]. Topic: Clinical - Prescription Issue >> Jul 30, 2023  9:37 AM Wynona Hedger wrote: Reason for CRM: Patient needs her prescription sent to her new pharmacy in her new state. [facility] [address] [place], [place] [address] Phone: [phone]

## 2023-08-12 ENCOUNTER — Other Ambulatory Visit: Payer: Self-pay | Admitting: Internal Medicine

## 2023-08-12 DIAGNOSIS — I10 Essential (primary) hypertension: Secondary | ICD-10-CM

## 2023-08-12 NOTE — Telephone Encounter (Signed)
 Last Fill: 05/26/23  Last OV: 02/11/23 Next OV: None Scheduled  Routing to provider for review/authorization.

## 2023-08-12 NOTE — Telephone Encounter (Signed)
 Copied from CRM [id number]. Topic: Clinical - Medication Refill >> Aug 12, 2023  9:28 AM Carrielelia G wrote: Medication: potassium chloride  SA (KLOR-CON  M) 20 MEQ tablet  Has the patient contacted their pharmacy? No (Agent: If no, request that the patient contact the pharmacy for the refill. If patient does not wish to contact the pharmacy document the reason why and proceed with request.) (Agent: If yes, when and what did the pharmacy advise?)  This is the patient's preferred pharmacy:  [facility] [address] [place] - [address] [place] [address] Phone: [phone] Fax: [phone]  Is this the correct pharmacy for this prescription? Yes If no, delete pharmacy and type the correct one.     Is the patient out of the medication? No  3 DAYS LEFT   Has the patient been seen for an appointment in the last year OR does the patient have an upcoming appointment? Yes  Can we respond through MyChart? No  Agent: Please be advised that Rx refills may take up to 3 business days. We ask that you follow-up with your pharmacy.

## 2023-08-17 NOTE — Telephone Encounter (Signed)
 RTC to patient-prescription needed was for Potassium.  Patient is now back in [place] .  Will [facility] to see if she has refills left here.  To call the Clinics if she is out of refills of the Potassium.

## 2023-08-18 MED ORDER — POTASSIUM CHLORIDE CRYS ER 20 MEQ PO TBCR: 30.0000 meq | EXTENDED_RELEASE_TABLET | Freq: Every day | ORAL | 3 refills | Status: AC

## 2023-08-21 IMAGING — DX DG TIBIA/FIBULA 2V*R*
4 series · 4 of 4 positions shown · non-contrast
Comparison: None.

CLINICAL DATA: Bone pain distal shin pain

EXAM:
RIGHT TIBIA AND FIBULA - 2 VIEW

[x tib-fib ap right (1 of 2)]
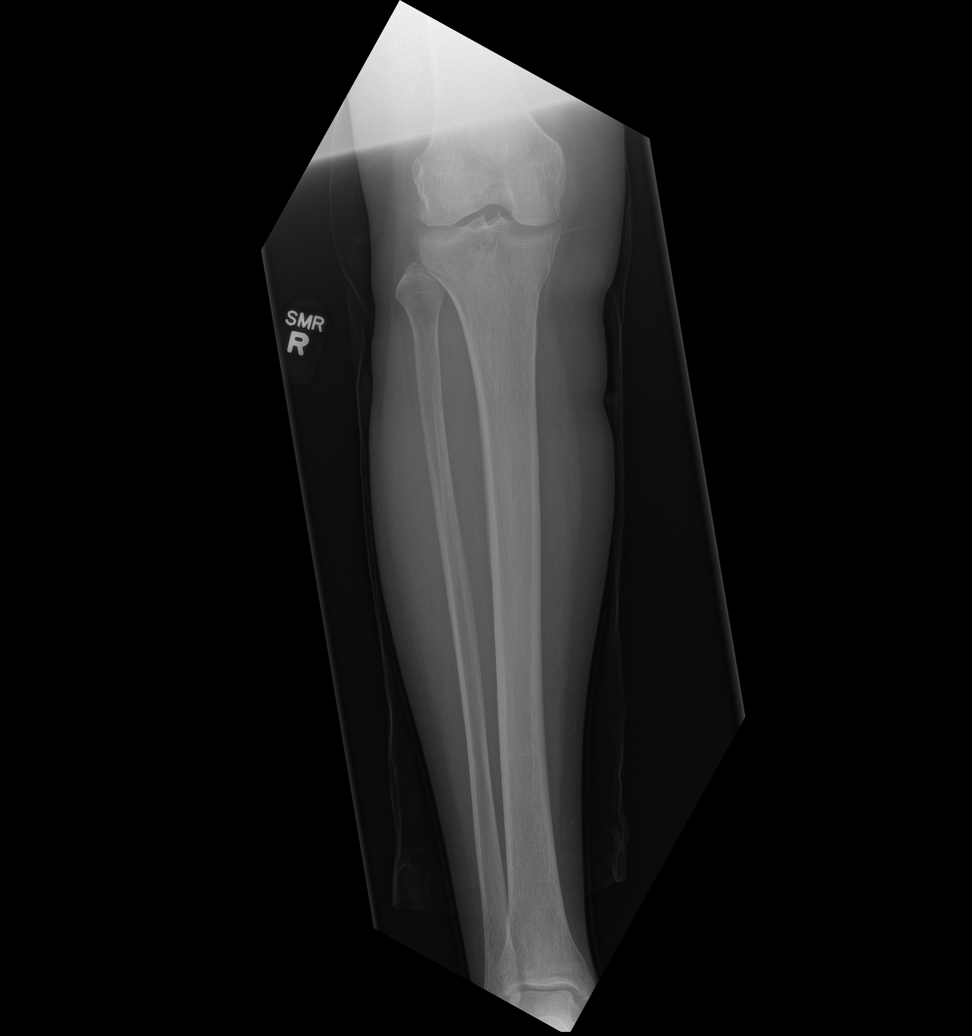

[x tib-fib ap right (2 of 2)]
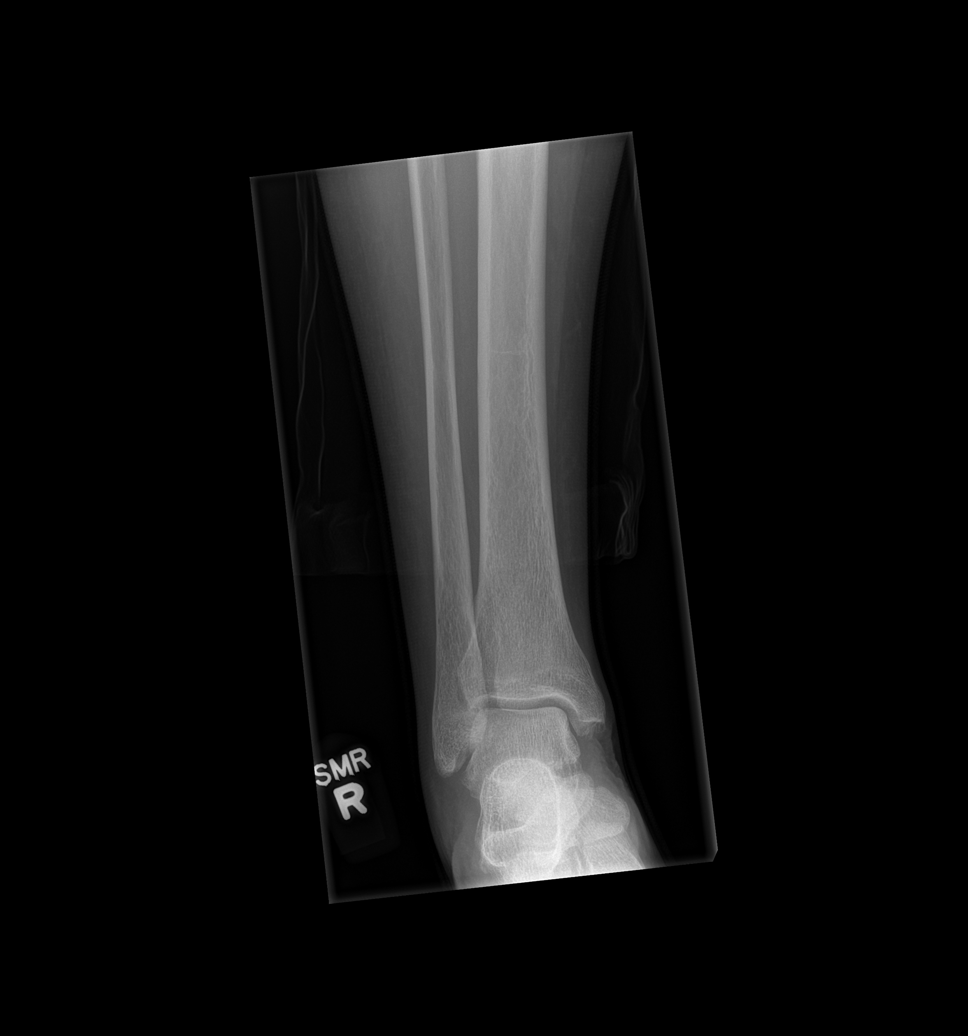

[x tib-fib lat right (1 of 2)]
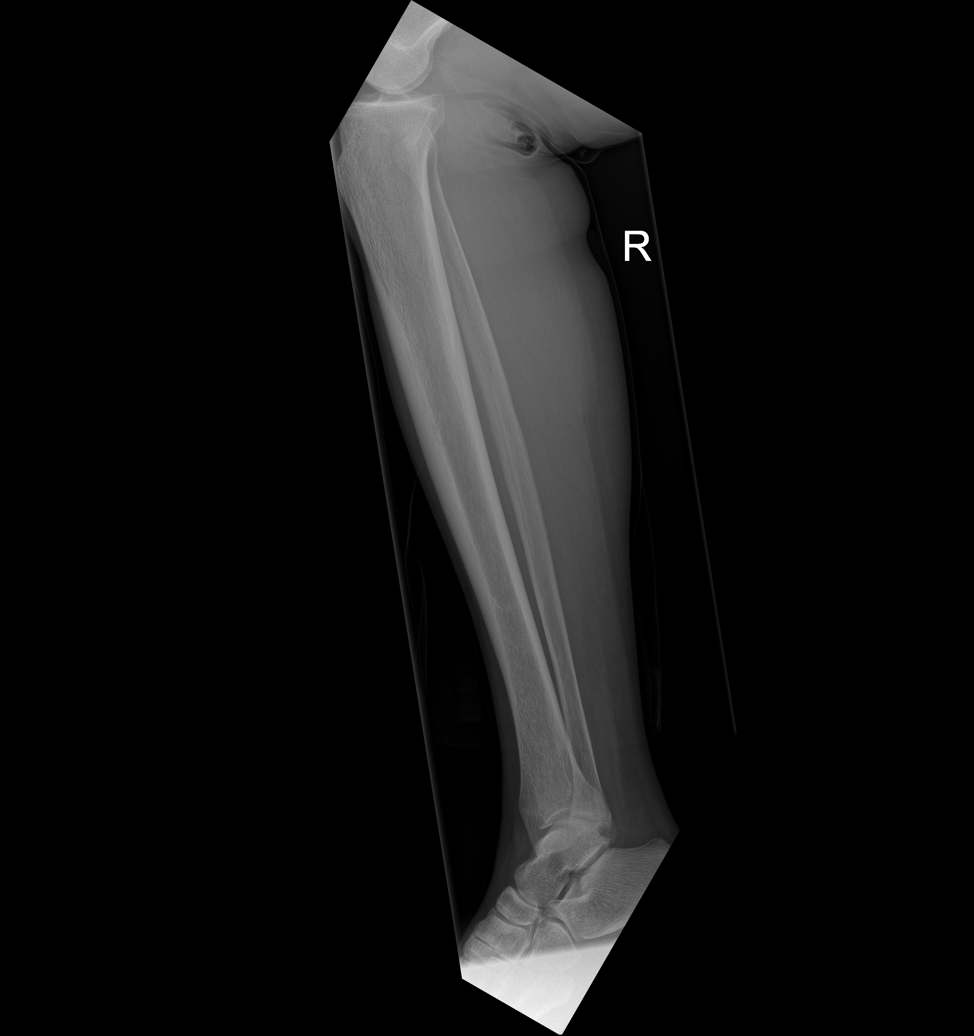

[x tib-fib lat right (2 of 2)]
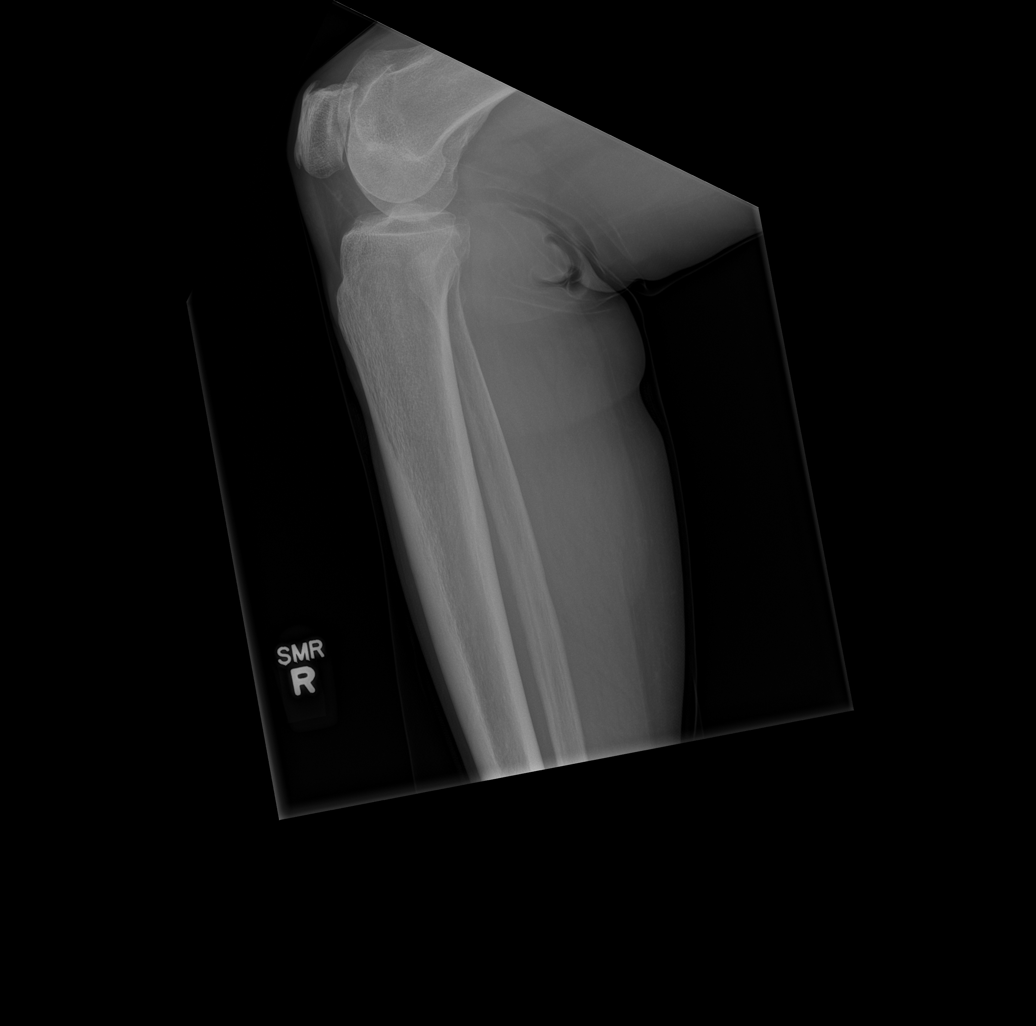

[4 of 4 positions shown; findings below may reference images not displayed]

FINDINGS: There is no evidence of fracture or other focal bone lesions. Soft
tissues are unremarkable.
IMPRESSION: Negative.

## 2023-10-08 ENCOUNTER — Ambulatory Visit (INDEPENDENT_AMBULATORY_CARE_PROVIDER_SITE_OTHER): Payer: Self-pay | Admitting: Student

## 2023-10-08 VITALS — BP 137/77 | HR 76 | Temp 98.4°F | Ht 64.0 in | Wt 136.0 lb

## 2023-10-08 DIAGNOSIS — L600 Ingrowing nail: Secondary | ICD-10-CM | POA: Diagnosis not present

## 2023-10-08 DIAGNOSIS — Z1211 Encounter for screening for malignant neoplasm of colon: Secondary | ICD-10-CM

## 2023-10-08 DIAGNOSIS — I10 Essential (primary) hypertension: Secondary | ICD-10-CM | POA: Diagnosis not present

## 2023-10-08 DIAGNOSIS — B351 Tinea unguium: Secondary | ICD-10-CM

## 2023-10-08 DIAGNOSIS — E7800 Pure hypercholesterolemia, unspecified: Secondary | ICD-10-CM | POA: Diagnosis not present

## 2023-10-08 NOTE — Patient Instructions (Addendum)
 Thank you, [name] for allowing us  to provide your care today. Today we discussed your general health and your ingrown toe nail.   I sent a referral to podiatry. They will call you to schedule a visit.  Be sure to get the shingles vaccine as discussed      I have ordered the following labs for you:  Lab Orders         Fecal occult blood, imunochemical         BMP8+Anion Gap         Lipid Profile       Tests ordered today:    Referrals ordered today:   Referral Orders         Ambulatory referral to Podiatry       I have ordered the following medication/changed the following medications:   Stop the following medications: There are no discontinued medications.   Start the following medications: No orders of the defined types were placed in this encounter.    Follow up: 6 months   Remember:   Should you have any questions or concerns please call the internal medicine clinic at [phone].   Barbara Lisa Grow MD 10/08/2023, 11:25 AM   [facility]

## 2023-10-08 NOTE — Progress Notes (Signed)
 CC: Routine follow up   HPI:  [name] is a 78 y.o. female living with a history stated below and presents today for routine follow up .She has no active concerns.Please see problem based assessment and plan for additional details.  Past Medical History:  Diagnosis Date   Carpal tunnel syndrome, left 03/30/2018   Diverticulosis of colon 09/10/2017   Descending and sigmoid colon found on colonoscopy 12/13/2014.   Essential hypertension    Gastroesophageal reflux disease 09/10/2017   Worsened by tomato products.   Glaucoma 09/10/2017   Followed by ophthalmologist in [facility].   Grade I diastolic dysfunction 09/10/2017   Per Echo 08/01/2017.  Asymptomatic.   History of transient ischemic attack involving anterior circulation 07/31/2017   Presented with transient left upper extremity weakness.  Standard inpatient evaluation unremarkable.  Considered asprin failure.   Overweight (BMI 25.0-29.9) 09/10/2017   Perennial allergic rhinitis with seasonal variation 09/10/2017   Mildly symptomatic.   Pure hypercholesterolemia    Third degree hemorrhoids 09/10/2017    Current Outpatient Medications on File Prior to Visit  Medication Sig Dispense Refill   atenolol -chlorthalidone  (TENORETIC ) 100-25 MG tablet Take 1 tablet by mouth every morning. 90 tablet 3   atorvastatin  (LIPITOR) 40 MG tablet TAKE 1 TABLET BY MOUTH ONCE DAILY AT  6  PM 90 tablet 3   ciclopirox  (LOPROX ) 0.77 % SUSP Apply 1 Application topically 2 (two) times daily. To the affected toenails. 60 mL 1   clopidogrel  (PLAVIX ) 75 MG tablet Take 1 tablet (75 mg total) by mouth daily. 90 tablet 3   latanoprost  (XALATAN ) 0.005 % ophthalmic solution Place 1 drop into both eyes daily.     levobunolol  (BETAGAN ) 0.5 % ophthalmic solution Place 1 drop into both eyes every morning.  1   potassium chloride  SA (KLOR-CON  M) 20 MEQ tablet Take 1.5 tablets (30 mEq total) by mouth daily. 135 tablet 3   timolol (TIMOPTIC) 0.5 % ophthalmic  solution 1 drop every morning.     No current facility-administered medications on file prior to visit.    Family History  Problem Relation Age of Onset   Kidney disease Mother        Required hemodialysis, died at the age of 30   Lung cancer Father        Never smoked   Hypertension Sister    Colon cancer Brother    Lymphoma Sister    COPD Sister    Colon cancer Brother    COPD Brother    Colon cancer Brother    Asthma Brother    Healthy Daughter    Healthy Son     Social History   Socioeconomic History   Marital status: Divorced    Spouse name: Not on file   Number of children: 2   Years of education: Not on file   Highest education level: Associate degree: academic program  Occupational History   Occupation: Neurosurgeon    Comment: Retired   Occupation: Manufacturing systems engineer    Comment: Retired   Occupation: Curator    Comment: Retired  Tobacco Use   Smoking status: Former    Current packs/day: 0.00    Types: Cigarettes    Start date: 06/27/1969    Quit date: 06/28/1974    Years since quitting: 49.3   Smokeless tobacco: Never  Vaping Use   Vaping status: Never Used  Substance and Sexual Activity   Alcohol use: No   Drug use: Never   Sexual  activity: Not Currently    Partners: Male  Other Topics Concern   Not on file  Social History Narrative   Lives alone in [place].  No pets. Originally from [place], [place] .  Husband died of colon cancer. Remarried then divorced.    Right handed    Caffeine: coffee, 1 cup daily, maybe an additional cup here and there   Social Drivers of Health   Financial Resource Strain: Low Risk  (06/24/2022)   Overall Financial Resource Strain (CARDIA)    Difficulty of Paying Living Expenses: Not hard at all  Food Insecurity: No Food Insecurity (06/24/2022)   Hunger Vital Sign    Worried About Running Out of Food in the Last Year: Never true    Ran Out of Food in the Last Year: Never true  Transportation Needs: No  Transportation Needs (06/24/2022)   PRAPARE - Administrator, Civil Service (Medical): No    Lack of Transportation (Non-Medical): No  Physical Activity: Insufficiently Active (06/24/2022)   Exercise Vital Sign    Days of Exercise per Week: 4 days    Minutes of Exercise per Session: 30 min  Stress: No Stress Concern Present (06/24/2022)   [employer] of Occupational Health - Occupational Stress Questionnaire    Feeling of Stress : Not at all  Social Connections: Moderately Integrated (09/10/2017)   Social Connection and Isolation Panel    Frequency of Communication with Friends and Family: More than three times a week    Frequency of Social Gatherings with Friends and Family: More than three times a week    Attends Religious Services: More than 4 times per year    Active Member of [employer] or Organizations: Yes    Attends Banker Meetings: More than 4 times per year    Marital Status: Widowed  Intimate Partner Violence: Not At Risk (09/10/2017)   Humiliation, Afraid, Rape, and Kick questionnaire    Fear of Current or Ex-Partner: No    Emotionally Abused: No    Physically Abused: No    Sexually Abused: No    Review of Systems: ROS negative except for what is noted on the assessment and plan.  Vitals:   10/08/23 1050  BP: (!) 159/77  Pulse: 69  Temp: 98.4 F (36.9 C)  TempSrc: Oral  SpO2: 96%  Weight: 136 lb (61.7 kg)  Height: 5' 4 (1.626 m)    Physical Exam: Constitutional: well-appearing woman, sitting in chair , in no acute distress Cardiovascular: regular rate and rhythm, no m/r/g Pulmonary/Chest: normal work of breathing on room air, lungs clear to auscultation bilaterally MSK: normal bulk and tone. Ingrown toe nail present  Neurological: alert & oriented x 3, no focal deficit Skin: warm and dry Psych: normal mood and behavior  Assessment & Plan:   Essential hypertension BP Readings from Last 3 Encounters:  10/08/23 (!) 159/77   02/11/23 (!) 152/88  06/24/22 135/61  The patient has a history of hypertension and is currently taking Tenoretic  (atenolol -chlorthalidone ). Chart review indicates that her blood pressure was previously well controlled on this regimen. However, her last two office readings have been somewhat elevated, raising the question of white coat hypertension versus true blood pressure elevation. Given her chronic hypokalemia, I believe she may benefit from transitioning to an ACE inhibitor or ARB for renal protection. That said, after discussing the options, the patient expressed comfort with her current regimen and prefers to continue it for now. - Continue atenolol -chlorthalidone  100/25 mg daily -  Revisit the possibility of switching to a more optimal antihypertensive regimen at her next visit  Pure hypercholesterolemia Previous lipid panel was at goal a year ago.  Stable on Lipitor 40 mg daily. - Repeat lipid panel -Continue Lipitor 40 mg  Ingrown toenail The patient has bilateral ingrown toenails but denies any associated pain or discomfort. Her primary concern is cosmetic, and she is interested in seeing a podiatrist to explore removal options. A referral to podiatry will be sent today. - Ambulatory referral to podiatry   Patient discussed with Dr. Jeanelle Drue Grow, M.D [facility] Phone: [phone] Date 10/08/2023 Time 11:38 AM

## 2023-10-08 NOTE — Assessment & Plan Note (Signed)
 BP Readings from Last 3 Encounters:  10/08/23 (!) 159/77  02/11/23 (!) 152/88  06/24/22 135/61  The patient has a history of hypertension and is currently taking Tenoretic  (atenolol -chlorthalidone ). Chart review indicates that her blood pressure was previously well controlled on this regimen. However, her last two office readings have been somewhat elevated, raising the question of white coat hypertension versus true blood pressure elevation. Given her chronic hypokalemia, I believe she may benefit from transitioning to an ACE inhibitor or ARB for renal protection. That said, after discussing the options, the patient expressed comfort with her current regimen and prefers to continue it for now. - Continue atenolol -chlorthalidone  100/25 mg daily - Revisit the possibility of switching to a more optimal antihypertensive regimen at her next visit

## 2023-10-08 NOTE — Assessment & Plan Note (Signed)
 The patient has bilateral ingrown toenails but denies any associated pain or discomfort. Her primary concern is cosmetic, and she is interested in seeing a podiatrist to explore removal options. A referral to podiatry will be sent today. - Ambulatory referral to podiatry

## 2023-10-08 NOTE — Assessment & Plan Note (Signed)
 Previous lipid panel was at goal a year ago.  Stable on Lipitor 40 mg daily. - Repeat lipid panel -Continue Lipitor 40 mg

## 2023-10-09 LAB — BMP8+ANION GAP
Anion Gap: 13 mmol/L (ref 10.0–18.0)
BUN/Creatinine Ratio: 17 (ref 12–28)
BUN: 12 mg/dL (ref 8–27)
CO2: 25 mmol/L (ref 20–29)
Calcium: 9.8 mg/dL (ref 8.7–10.3)
Chloride: 93 mmol/L — ABNORMAL LOW (ref 96–106)
Creatinine, Ser: 0.7 mg/dL (ref 0.57–1.00)
Glucose: 91 mg/dL (ref 70–99)
Potassium: 3.7 mmol/L (ref 3.5–5.2)
Sodium: 131 mmol/L — ABNORMAL LOW (ref 134–144)
eGFR: 90 mL/min/1.73 (ref >59)

## 2023-10-09 LAB — LIPID PANEL
Chol/HDL Ratio: 2.3 ratio (ref 0.0–4.4)
Cholesterol, Total: 147 mg/dL (ref 100–199)
HDL: 63 mg/dL (ref >39)
LDL Chol Calc (NIH): 71 mg/dL (ref 0–99)
Triglycerides: 66 mg/dL (ref 0–149)
VLDL Cholesterol Cal: 13 mg/dL (ref 5–40)

## 2023-10-13 DIAGNOSIS — H401131 Primary open-angle glaucoma, bilateral, mild stage: Secondary | ICD-10-CM | POA: Diagnosis not present

## 2023-10-13 DIAGNOSIS — H04123 Dry eye syndrome of bilateral lacrimal glands: Secondary | ICD-10-CM | POA: Diagnosis not present

## 2023-10-13 DIAGNOSIS — H2513 Age-related nuclear cataract, bilateral: Secondary | ICD-10-CM | POA: Diagnosis not present

## 2023-10-14 NOTE — Progress Notes (Signed)
 [facility] Attending  Case discussed with the resident at the time of the visit.  We reviewed the resident's history and exam and pertinent patient test results.  I agree with the assessment, diagnosis, and plan of care documented in the resident's note.

## 2023-10-15 ENCOUNTER — Ambulatory Visit (INDEPENDENT_AMBULATORY_CARE_PROVIDER_SITE_OTHER): Admitting: Podiatry

## 2023-10-15 DIAGNOSIS — L600 Ingrowing nail: Secondary | ICD-10-CM | POA: Diagnosis not present

## 2023-10-15 NOTE — Patient Instructions (Signed)

## 2023-10-18 NOTE — Progress Notes (Signed)
 Subjective:   Patient ID: Barbara Holt, female   DOB: 78 y.o.   MRN: [id number]   HPI Patient presents stating she damaged her big toenail on her left foot and she wants to have it removed as it is loose and it can be bothersome and can fall off at different times.  Does not remember specific injury does not smoke likes to be active   Review of Systems  All other systems reviewed and are negative.       Objective:  Physical Exam Vitals and nursing note reviewed.  Constitutional:      Appearance: She is well-developed.  Pulmonary:     Effort: Pulmonary effort is normal.  Musculoskeletal:        General: Normal range of motion.  Skin:    General: Skin is warm.  Neurological:     Mental Status: She is alert.     Neurovascular status intact muscle strength found to be within normal limits with patient noted to have a severely thickened dystrophic painful left big toenail.  It is loose and hard for her to trim and patient was noted to have good digital perfusion well-oriented x 3     Assessment:  Damage to the left hallux nailbed secondary to trauma painful     Plan:  H&P reviewed recommended removal of nailbed permanent explained procedure risk she read then signed consent form and today I infiltrated the left big toe 60 mg Xylocaine Marcaine mixture sterile prep done using sterile instrumentation removed the hallux nail exposed matrix applied phenol 5 applications 30 seconds followed by alcohol sterile dressing gave instructions on soaks wear dressing 24 hours take it off earlier if throbbing were to occur and encouraged to call with questions concerns

## 2023-11-25 ENCOUNTER — Ambulatory Visit: Admitting: Podiatry

## 2023-12-29 ENCOUNTER — Other Ambulatory Visit: Payer: Self-pay | Admitting: Student

## 2023-12-29 DIAGNOSIS — Z1231 Encounter for screening mammogram for malignant neoplasm of breast: Secondary | ICD-10-CM

## 2024-02-10 ENCOUNTER — Ambulatory Visit

## 2024-02-22 DIAGNOSIS — H04123 Dry eye syndrome of bilateral lacrimal glands: Secondary | ICD-10-CM | POA: Diagnosis not present

## 2024-02-22 DIAGNOSIS — H401131 Primary open-angle glaucoma, bilateral, mild stage: Secondary | ICD-10-CM | POA: Diagnosis not present

## 2024-02-22 DIAGNOSIS — H2513 Age-related nuclear cataract, bilateral: Secondary | ICD-10-CM | POA: Diagnosis not present

## 2024-03-02 ENCOUNTER — Ambulatory Visit: Admission: RE | Admit: 2024-03-02 | Discharge: 2024-03-02 | Disposition: A | Source: Ambulatory Visit

## 2024-03-02 DIAGNOSIS — Z1231 Encounter for screening mammogram for malignant neoplasm of breast: Secondary | ICD-10-CM | POA: Diagnosis not present

## 2024-03-10 ENCOUNTER — Ambulatory Visit: Payer: Self-pay | Admitting: Student
